# Patient Record
Sex: Male | Born: 1985
Health system: Southern US, Community
[De-identification: ages and names within clinical notes are randomized; demographics above are authoritative.]

## PROBLEM LIST (undated history)

## (undated) DIAGNOSIS — E7800 Pure hypercholesterolemia, unspecified: Secondary | ICD-10-CM

## (undated) HISTORY — PX: FOOT SURGERY: SHX648

## (undated) HISTORY — PX: OTHER SURGICAL HISTORY: SHX169

---

## 2014-09-24 ENCOUNTER — Emergency Department (HOSPITAL_BASED_OUTPATIENT_CLINIC_OR_DEPARTMENT_OTHER)
Admission: EM | Admit: 2014-09-24 | Discharge: 2014-09-25 | Disposition: A | Discharge status: Home / Self Care | Payer: 59 | Attending: Emergency Medicine | Admitting: Emergency Medicine

## 2014-09-24 ENCOUNTER — Encounter (HOSPITAL_BASED_OUTPATIENT_CLINIC_OR_DEPARTMENT_OTHER): Payer: Self-pay | Admitting: *Deleted

## 2014-09-24 ENCOUNTER — Emergency Department (HOSPITAL_BASED_OUTPATIENT_CLINIC_OR_DEPARTMENT_OTHER): Payer: 59

## 2014-09-24 DIAGNOSIS — R1033 Periumbilical pain: Secondary | ICD-10-CM

## 2014-09-24 DIAGNOSIS — R109 Unspecified abdominal pain: Secondary | ICD-10-CM | POA: Diagnosis present

## 2014-09-24 DIAGNOSIS — Z880 Allergy status to penicillin: Secondary | ICD-10-CM | POA: Diagnosis not present

## 2014-09-24 DIAGNOSIS — R1031 Right lower quadrant pain: Secondary | ICD-10-CM | POA: Insufficient documentation

## 2014-09-24 DIAGNOSIS — R1030 Lower abdominal pain, unspecified: Secondary | ICD-10-CM | POA: Insufficient documentation

## 2014-09-24 LAB — URINALYSIS, ROUTINE W REFLEX MICROSCOPIC
BILIRUBIN URINE: NEGATIVE
GLUCOSE, UA: NEGATIVE mg/dL
HGB URINE DIPSTICK: NEGATIVE
Ketones, ur: NEGATIVE mg/dL
LEUKOCYTES UA: NEGATIVE
Nitrite: NEGATIVE
PH: 6.5 (ref 5.0–8.0)
Protein, ur: NEGATIVE mg/dL
SPECIFIC GRAVITY, URINE: 1.025 (ref 1.005–1.030)
Urobilinogen, UA: 1 mg/dL (ref 0.0–1.0)

## 2014-09-24 NOTE — ED Notes (Signed)
Pt reports RLQ pain intermittent x 2-3 days.  LBM today-normal.  Denies fevers, denies N/V/D, denies urinary symptoms.

## 2014-09-24 NOTE — ED Provider Notes (Signed)
CSN: [id number]     Arrival date & time 09/24/14  2141 History  This chart was scribed for Geoffery Lyonsouglas Banyan Goodchild, MD by Chestine SporeSoijett Blue, ED Scribe. The patient was seen in room MH02/MH02 at 11:34 PM.     Chief Complaint  Patient presents with  . Abdominal Pain      Patient is a 29 y.o. male presenting with abdominal pain. The history is provided by the patient. No language interpreter was used.  Abdominal Pain Pain location:  Suprapubic Pain quality: sharp   Pain radiates to:  Does not radiate Pain severity:  Moderate Onset quality:  Sudden Duration:  3 days Timing:  Sporadic Progression:  Unchanged Chronicity:  New Relieved by:  Nothing Worsened by:  Nothing tried Ineffective treatments:  Antacids Associated symptoms: no constipation, no diarrhea, no fever and no hematuria     HPI Comments: Corwin LevinsBrandon Methot is a 29 y.o. male who presents to the Emergency Department complaining of sharp RLQ abdominal pain onset 2-3 days.  Pt left work because of the pain. He states that he has tried [place] with no relief for his symptoms. He denies constipation, diarrhea, fever, blood in stool, and any other symptoms. Pt had his wisdom teeth pulled Friday and was given pain medications.    History reviewed. No pertinent past medical history. History reviewed. No pertinent past surgical history. History reviewed. No pertinent family history. History  Substance Use Topics  . Smoking status: Never Smoker   . Smokeless tobacco: Not on file  . Alcohol Use: No    Review of Systems  Constitutional: Negative for fever.  Gastrointestinal: Positive for abdominal pain. Negative for diarrhea, constipation and blood in stool.  Genitourinary: Negative for urgency, frequency and hematuria.  All other systems reviewed and are negative.     Allergies  Penicillins  Home Medications   Prior to Admission medications   Not on File   BP 133/70 mmHg  Pulse 77  Temp(Src) 98.3 F (36.8 C) (Oral)  Resp 18  Ht 5'  10" (1.778 m)  Wt 215 lb (97.523 kg)  BMI 30.85 kg/m2  SpO2 99% Physical Exam  Constitutional: He is oriented to person, place, and time. He appears well-developed and well-nourished. No distress.  HENT:  Head: Normocephalic and atraumatic.  Eyes: EOM are normal.  Neck: Neck supple. No tracheal deviation present.  Cardiovascular: Normal rate, regular rhythm and normal heart sounds.   No murmur heard. Pulmonary/Chest: Effort normal and breath sounds normal. No respiratory distress.  Abdominal: Soft. Bowel sounds are normal. He exhibits no distension. There is tenderness.  TTP in the periumbilical region.   Musculoskeletal: Normal range of motion.  Neurological: He is alert and oriented to person, place, and time.  Skin: Skin is warm and dry.  Psychiatric: He has a normal mood and affect. His behavior is normal.  Nursing note and vitals reviewed.   ED Course  Procedures (including critical care time) DIAGNOSTIC STUDIES: Oxygen Saturation is 99% on room air, normal by my interpretation.    COORDINATION OF CARE: 11:37 PM-Discussed treatment plan which includes CT renal stone study, UA with pt at bedside and pt agreed to plan.   Labs Review Labs Reviewed  URINALYSIS, ROUTINE W REFLEX MICROSCOPIC    Imaging Review No results found.   EKG Interpretation None      MDM   Final diagnoses:  None    Laboratory studies, urinalysis, and CT of the abdomen and pelvis are negative for acute process. He will be discharged  with pain meds and when necessary return.    Geoffery Lyons, MD 09/25/14 [id number]

## 2014-09-25 LAB — CBC WITH DIFFERENTIAL/PLATELET
BASOS PCT: 0 % (ref 0–1)
Basophils Absolute: 0 10*3/uL (ref 0.0–0.1)
Eosinophils Absolute: 0.2 10*3/uL (ref 0.0–0.7)
Eosinophils Relative: 2 % (ref 0–5)
HCT: 41.2 % (ref 39.0–52.0)
Hemoglobin: 14 g/dL (ref 13.0–17.0)
LYMPHS ABS: 2.5 10*3/uL (ref 0.7–4.0)
LYMPHS PCT: 33 % (ref 12–46)
MCH: 29.1 pg (ref 26.0–34.0)
MCHC: 34 g/dL (ref 30.0–36.0)
MCV: 85.7 fL (ref 78.0–100.0)
Monocytes Absolute: 0.6 10*3/uL (ref 0.1–1.0)
Monocytes Relative: 8 % (ref 3–12)
NEUTROS PCT: 57 % (ref 43–77)
Neutro Abs: 4.4 10*3/uL (ref 1.7–7.7)
Platelets: 231 10*3/uL (ref 150–400)
RBC: 4.81 MIL/uL (ref 4.22–5.81)
RDW: 12.3 % (ref 11.5–15.5)
WBC: 7.7 10*3/uL (ref 4.0–10.5)

## 2014-09-25 LAB — BASIC METABOLIC PANEL
Anion gap: 8 (ref 5–15)
BUN: 17 mg/dL (ref 6–23)
CHLORIDE: 102 mmol/L (ref 96–112)
CO2: 27 mmol/L (ref 19–32)
Calcium: 8.9 mg/dL (ref 8.4–10.5)
Creatinine, Ser: 1.04 mg/dL (ref 0.50–1.35)
GFR calc Af Amer: >90 mL/min (ref >90)
Glucose, Bld: 90 mg/dL (ref 70–99)
Potassium: 3.6 mmol/L (ref 3.5–5.1)
Sodium: 137 mmol/L (ref 135–145)

## 2014-09-25 MED ORDER — TRAMADOL HCL 50 MG PO TABS: 50.0000 mg | ORAL_TABLET | Freq: Four times a day (QID) | ORAL | Status: DC | PRN

## 2014-09-25 NOTE — Discharge Instructions (Signed)
Prilosec 20 mg twice daily for the next 2 weeks.  Tramadol as prescribed as needed for pain.  Return to the emergency department for worsening pain, high fever, bloody stool, or other new and concerning symptoms.   Abdominal Pain Many things can cause abdominal pain. Usually, abdominal pain is not caused by a disease and will improve without treatment. It can often be observed and treated at home. Your health care provider will do a physical exam and possibly order blood tests and X-rays to help determine the seriousness of your pain. However, in many cases, more time must pass before a clear cause of the pain can be found. Before that point, your health care provider may not know if you need more testing or further treatment. HOME CARE INSTRUCTIONS  Monitor your abdominal pain for any changes. The following actions may help to alleviate any discomfort you are experiencing:  Only take over-the-counter or prescription medicines as directed by your health care provider.  Do not take laxatives unless directed to do so by your health care provider.  Try a clear liquid diet (broth, tea, or water) as directed by your health care provider. Slowly move to a bland diet as tolerated. SEEK MEDICAL CARE IF:  You have unexplained abdominal pain.  You have abdominal pain associated with nausea or diarrhea.  You have pain when you urinate or have a bowel movement.  You experience abdominal pain that wakes you in the night.  You have abdominal pain that is worsened or improved by eating food.  You have abdominal pain that is worsened with eating fatty foods.  You have a fever. SEEK IMMEDIATE MEDICAL CARE IF:   Your pain does not go away within 2 hours.  You keep throwing up (vomiting).  Your pain is felt only in portions of the abdomen, such as the right side or the left lower portion of the abdomen.  You pass bloody or black tarry stools. MAKE SURE YOU:  Understand these instructions.    Will watch your condition.   Will get help right away if you are not doing well or get worse.  Document Released: 04/13/2005 Document Revised: 07/09/2013 Document Reviewed: 03/13/2013 [facility] Patient Information 2015 [employer] [place]. This information is not intended to replace advice given to you by your health care provider. Make sure you discuss any questions you have with your health care provider.

## 2015-04-08 ENCOUNTER — Encounter (HOSPITAL_BASED_OUTPATIENT_CLINIC_OR_DEPARTMENT_OTHER): Payer: Self-pay | Admitting: Emergency Medicine

## 2015-04-08 ENCOUNTER — Emergency Department (HOSPITAL_BASED_OUTPATIENT_CLINIC_OR_DEPARTMENT_OTHER)
Admission: EM | Admit: 2015-04-08 | Discharge: 2015-04-08 | Disposition: A | Discharge status: Home / Self Care | Payer: BLUE CROSS/BLUE SHIELD | Attending: Emergency Medicine | Admitting: Emergency Medicine

## 2015-04-08 DIAGNOSIS — Z880 Allergy status to penicillin: Secondary | ICD-10-CM | POA: Insufficient documentation

## 2015-04-08 DIAGNOSIS — J029 Acute pharyngitis, unspecified: Secondary | ICD-10-CM | POA: Diagnosis not present

## 2015-04-08 LAB — RAPID STREP SCREEN (MED CTR MEBANE ONLY): Streptococcus, Group A Screen (Direct): NEGATIVE

## 2015-04-08 MED ORDER — GI COCKTAIL ~~LOC~~
30.0000 mL | Freq: Once | ORAL | Status: AC
  Administered 2015-04-08: 30 mL via ORAL
  Filled 2015-04-08: qty 30

## 2015-04-08 MED ORDER — LORATADINE 10 MG PO TABS
10.0000 mg | ORAL_TABLET | Freq: Once | ORAL | Status: AC
  Administered 2015-04-08: 10 mg via ORAL
  Filled 2015-04-08: qty 1

## 2015-04-08 MED ORDER — IBUPROFEN 800 MG PO TABS
800.0000 mg | ORAL_TABLET | Freq: Once | ORAL | Status: AC
  Administered 2015-04-08: 800 mg via ORAL
  Filled 2015-04-08: qty 1

## 2015-04-08 MED ORDER — CETIRIZINE-PSEUDOEPHEDRINE ER 5-120 MG PO TB12: 1.0000 | ORAL_TABLET | Freq: Two times a day (BID) | ORAL | Status: DC

## 2015-04-08 MED ORDER — IBUPROFEN 800 MG PO TABS: 800.0000 mg | ORAL_TABLET | Freq: Three times a day (TID) | ORAL | Status: DC

## 2015-04-08 NOTE — ED Notes (Signed)
Started having sore throat on Sunday and just getting worse

## 2015-04-08 NOTE — ED Provider Notes (Signed)
CSN: [id number]     Arrival date & time 04/08/15  0023 History   First MD Initiated Contact with Patient 04/08/15 0125     Chief Complaint  Patient presents with  . Sore Throat     (Consider location/radiation/quality/duration/timing/severity/associated sxs/prior Treatment) Patient is a 29 y.o. male presenting with pharyngitis. The history is provided by the patient.  Sore Throat This is a new problem. The current episode started more than 2 days ago. The problem has not changed since onset.Pertinent negatives include no chest pain, no abdominal pain, no headaches and no shortness of breath. Nothing aggravates the symptoms. Nothing relieves the symptoms. Treatments tried: theraflu. The treatment provided no relief.    History reviewed. No pertinent past medical history. History reviewed. No pertinent past surgical history. History reviewed. No pertinent family history. Social History  Substance Use Topics  . Smoking status: Never Smoker   . Smokeless tobacco: None  . Alcohol Use: No    Review of Systems  Constitutional: Negative for fever and fatigue.  HENT: Positive for congestion and sore throat. Negative for drooling, ear pain, facial swelling, trouble swallowing and voice change.   Respiratory: Negative for shortness of breath.   Cardiovascular: Negative for chest pain.  Gastrointestinal: Negative for abdominal pain.  Neurological: Negative for headaches.  All other systems reviewed and are negative.     Allergies  Penicillins  Home Medications   Prior to Admission medications   Medication Sig Start Date End Date Taking? Authorizing Provider  traMADol (ULTRAM) 50 MG tablet Take 1 tablet (50 mg total) by mouth every 6 (six) hours as needed. 09/25/14   Geoffery Lyons, MD   BP 120/78 mmHg  Pulse 79  Temp(Src) 98.3 F (36.8 C) (Oral)  Resp 18  Ht 6' (1.829 m)  Wt 255 lb (115.667 kg)  BMI 34.58 kg/m2  SpO2 97% Physical Exam  Constitutional: He is oriented to  person, place, and time. He appears well-developed and well-nourished. No distress.  HENT:  Head: Normocephalic and atraumatic.  Mouth/Throat: Oropharynx is clear and moist. No oropharyngeal exudate.  Eyes: Conjunctivae and EOM are normal. Pupils are equal, round, and reactive to light.  Neck: Normal range of motion. Neck supple. No tracheal deviation present.  Intact phonation no pain with displacement of the trachea  Cardiovascular: Normal rate, regular rhythm and intact distal pulses.   Pulmonary/Chest: Effort normal and breath sounds normal. No respiratory distress. He has no wheezes. He has no rales.  Abdominal: Soft. Bowel sounds are normal. There is no tenderness. There is no rebound and no guarding.  Musculoskeletal: Normal range of motion.  Lymphadenopathy:    He has no cervical adenopathy.  Neurological: He is alert and oriented to person, place, and time.  Skin: Skin is warm and dry.  Psychiatric: He has a normal mood and affect.    ED Course  Procedures (including critical care time) Labs Review Labs Reviewed  RAPID STREP SCREEN (NOT AT [facility])  CULTURE, GROUP A STREP    Imaging Review No results found. I have personally reviewed and evaluated these images and lab results as part of my medical decision-making.   EKG Interpretation None      MDM   Final diagnoses:  None    Based on Centor criteria there is no indication for further testing or treatment.  Well appearing intact phonation.  Will treat sore throat symptomatically    April Palumbo, MD 04/08/15 0145

## 2015-04-08 NOTE — ED Notes (Signed)
MD at bedside. 

## 2015-04-11 LAB — CULTURE, GROUP A STREP

## 2015-05-24 ENCOUNTER — Emergency Department (HOSPITAL_BASED_OUTPATIENT_CLINIC_OR_DEPARTMENT_OTHER): Payer: BLUE CROSS/BLUE SHIELD

## 2015-05-24 ENCOUNTER — Emergency Department (HOSPITAL_BASED_OUTPATIENT_CLINIC_OR_DEPARTMENT_OTHER)
Admission: EM | Admit: 2015-05-24 | Discharge: 2015-05-24 | Disposition: A | Discharge status: Home / Self Care | Payer: BLUE CROSS/BLUE SHIELD | Attending: Emergency Medicine | Admitting: Emergency Medicine

## 2015-05-24 ENCOUNTER — Encounter (HOSPITAL_BASED_OUTPATIENT_CLINIC_OR_DEPARTMENT_OTHER): Payer: Self-pay | Admitting: Emergency Medicine

## 2015-05-24 DIAGNOSIS — W270XXA Contact with workbench tool, initial encounter: Secondary | ICD-10-CM | POA: Diagnosis not present

## 2015-05-24 DIAGNOSIS — S60011A Contusion of right thumb without damage to nail, initial encounter: Secondary | ICD-10-CM | POA: Insufficient documentation

## 2015-05-24 DIAGNOSIS — Z79899 Other long term (current) drug therapy: Secondary | ICD-10-CM | POA: Insufficient documentation

## 2015-05-24 DIAGNOSIS — Z880 Allergy status to penicillin: Secondary | ICD-10-CM | POA: Insufficient documentation

## 2015-05-24 DIAGNOSIS — Y990 Civilian activity done for income or pay: Secondary | ICD-10-CM | POA: Insufficient documentation

## 2015-05-24 DIAGNOSIS — Z791 Long term (current) use of non-steroidal anti-inflammatories (NSAID): Secondary | ICD-10-CM | POA: Insufficient documentation

## 2015-05-24 DIAGNOSIS — Y9289 Other specified places as the place of occurrence of the external cause: Secondary | ICD-10-CM | POA: Insufficient documentation

## 2015-05-24 DIAGNOSIS — Y9389 Activity, other specified: Secondary | ICD-10-CM | POA: Diagnosis not present

## 2015-05-24 DIAGNOSIS — S6010XA Contusion of unspecified finger with damage to nail, initial encounter: Secondary | ICD-10-CM

## 2015-05-24 DIAGNOSIS — S6991XA Unspecified injury of right wrist, hand and finger(s), initial encounter: Secondary | ICD-10-CM | POA: Diagnosis present
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
## 2015-05-24 NOTE — Discharge Instructions (Signed)
Subungual Hematoma A subungual hematoma is a pocket of blood that collects under the fingernail or toenail. The pressure created by the blood under the nail can cause pain. CAUSES  A subungual hematoma occurs when an injury to the finger or toe causes a blood vessel beneath the nail to break. The injury can occur from a direct blow such as slamming a finger in a door. It can also occur from a repeated injury such as pressure on the foot in a shoe while running. A subungual hematoma is sometimes called runner's toe or tennis toe. SYMPTOMS   Blue or dark blue skin under the nail.  Pain or throbbing in the injured area. DIAGNOSIS  Your caregiver can determine whether you have a subungual hematoma based on your history and a physical exam. If your caregiver thinks you might have a broken (fractured) bone, X-rays may be taken. TREATMENT  Hematomas usually go away on their own over time. Your caregiver may make a hole in the nail to drain the blood. Draining the blood is painless and usually provides significant relief from pain and throbbing. The nail usually grows back normally after this procedure. In some cases, the nail may need to be removed. This is done if there is a cut under the nail that requires stitches (sutures). HOME CARE INSTRUCTIONS   Put ice on the injured area.  Put ice in a plastic bag.  Place a towel between your skin and the bag.  Leave the ice on for 15-20 minutes, 03-04 times a day for the first 1 to 2 days.  Elevate the injured area to help decrease pain and swelling.  If you were given a bandage, wear it for as long as directed by your caregiver.  If part of your nail falls off, trim the remaining nail gently. This prevents the nail from catching on something and causing further injury.  Only take over-the-counter or prescription medicines for pain, discomfort, or fever as directed by your caregiver. SEEK IMMEDIATE MEDICAL CARE IF:   You have redness or swelling  around the nail.  You have yellowish-white fluid (pus) coming from the nail.  Your pain is not controlled with medicine.  You have a fever. MAKE SURE YOU:  Understand these instructions.  Will watch your condition.  Will get help right away if you are not doing well or get worse.   This information is not intended to replace advice given to you by your health care provider. Make sure you discuss any questions you have with your health care provider.   Document Released: 07/01/2000 Document Revised: 09/26/2011 Document Reviewed: 11/19/2014 Elsevier Interactive Patient Education 2016 [employer]  
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
## 2015-05-24 NOTE — ED Notes (Signed)
Pt made aware to return if symptoms worsen or if any life threatening symptoms occur.   

## 2015-06-16 ENCOUNTER — Emergency Department (HOSPITAL_BASED_OUTPATIENT_CLINIC_OR_DEPARTMENT_OTHER)
Admission: EM | Admit: 2015-06-16 | Discharge: 2015-06-16 | Disposition: A | Discharge status: Home / Self Care | Payer: BLUE CROSS/BLUE SHIELD | Attending: Emergency Medicine | Admitting: Emergency Medicine

## 2015-06-16 ENCOUNTER — Encounter (HOSPITAL_BASED_OUTPATIENT_CLINIC_OR_DEPARTMENT_OTHER): Payer: Self-pay | Admitting: Emergency Medicine

## 2015-06-16 ENCOUNTER — Emergency Department (HOSPITAL_BASED_OUTPATIENT_CLINIC_OR_DEPARTMENT_OTHER): Payer: BLUE CROSS/BLUE SHIELD

## 2015-06-16 DIAGNOSIS — Z880 Allergy status to penicillin: Secondary | ICD-10-CM | POA: Insufficient documentation

## 2015-06-16 DIAGNOSIS — L739 Follicular disorder, unspecified: Secondary | ICD-10-CM | POA: Diagnosis not present

## 2015-06-16 DIAGNOSIS — Z79899 Other long term (current) drug therapy: Secondary | ICD-10-CM | POA: Diagnosis not present

## 2015-06-16 DIAGNOSIS — R21 Rash and other nonspecific skin eruption: Secondary | ICD-10-CM | POA: Diagnosis present

## 2015-06-16 DIAGNOSIS — M79662 Pain in left lower leg: Secondary | ICD-10-CM | POA: Insufficient documentation

## 2015-06-16 MED ORDER — ACETAMINOPHEN 500 MG PO TABS
1000.0000 mg | ORAL_TABLET | Freq: Once | ORAL | Status: AC
  Administered 2015-06-16: 1000 mg via ORAL
  Filled 2015-06-16: qty 2

## 2015-06-16 MED ORDER — SULFAMETHOXAZOLE-TRIMETHOPRIM 800-160 MG PO TABS: 1.0000 | ORAL_TABLET | Freq: Two times a day (BID) | ORAL | Status: DC

## 2015-06-16 NOTE — ED Provider Notes (Signed)
CSN: [id number]     Arrival date & time 06/16/15  1714 History   First MD Initiated Contact with Patient 06/16/15 1724     Chief Complaint  Patient presents with  . Rash     (Consider location/radiation/quality/duration/timing/severity/associated sxs/prior Treatment) HPI Comments: Patient is a 29 year old male with no significant past medical history. He presents for evaluation of rash to both legs. He states that he started with small bumps to his legs yesterday morning. He denies any new exposures. He denies any fevers or chills at home. He also reports waking up yesterday with pain in his left calf in the absence of any injury or trauma.  Patient is a 29 y.o. male presenting with rash. The history is provided by the patient.  Rash Location: Legs. Quality: itchiness   Severity:  Moderate Onset quality:  Sudden Duration:  3 days Timing:  Constant Progression:  Worsening Chronicity:  New   History reviewed. No pertinent past medical history. History reviewed. No pertinent past surgical history. History reviewed. No pertinent family history. Social History  Substance Use Topics  . Smoking status: Never Smoker   . Smokeless tobacco: None  . Alcohol Use: No    Review of Systems  Skin: Positive for rash.  All other systems reviewed and are negative.     Allergies  Penicillins  Home Medications   Prior to Admission medications   Medication Sig Start Date End Date Taking? Authorizing Provider  cetirizine-pseudoephedrine (ZYRTEC-D) 5-120 MG per tablet Take 1 tablet by mouth 2 (two) times daily. 04/08/15   April Palumbo, MD  ibuprofen (ADVIL,MOTRIN) 800 MG tablet Take 1 tablet (800 mg total) by mouth 3 (three) times daily. 04/08/15   April Palumbo, MD  traMADol (ULTRAM) 50 MG tablet Take 1 tablet (50 mg total) by mouth every 6 (six) hours as needed. 09/25/14   Geoffery Lyonsouglas Jewel Mcafee, MD   BP 130/81 mmHg  Pulse 88  Temp(Src) 100.1 F (37.8 C) (Oral)  Resp 18  Ht 6' (1.829 m)  Wt  245 lb (111.131 kg)  BMI 33.22 kg/m2  SpO2 99% Physical Exam  Constitutional: He is oriented to person, place, and time. He appears well-developed and well-nourished. No distress.  HENT:  Head: Normocephalic and atraumatic.  Neck: Normal range of motion. Neck supple.  Musculoskeletal: Normal range of motion. He exhibits no edema.  The left calf appears grossly normal. There is no tenderness.  Homans sign is absent.  Neurological: He is alert and oriented to person, place, and time.  Skin: Skin is warm and dry. He is not diaphoretic.  There are multiple, small pustules to the lower extremities.  Nursing note and vitals reviewed.   ED Course  Procedures (including critical care time) Labs Review Labs Reviewed - No data to display  Imaging Review No results found. I have personally reviewed and evaluated these images and lab results as part of my medical decision-making.   EKG Interpretation None      MDM   Final diagnoses:  None    Ultrasound is negative for DVT. I suspect a musculoskeletal etiology to the calf pain. He does appear to have a folliculitis to his legs. This will be treated with Bactrim and when necessary follow-up.    Geoffery Lyonsouglas Shatoya Roets, MD 06/16/15 1900

## 2015-06-16 NOTE — Discharge Instructions (Signed)
Ibuprofen 600 mg every 6 hours as needed for pain.  Bactrim as prescribed.  Return to the emergency department if symptoms significantly worsen or change.   Folliculitis Folliculitis is redness, soreness, and swelling (inflammation) of the hair follicles. This condition can occur anywhere on the body. People with weakened immune systems, diabetes, or obesity have a greater risk of getting folliculitis. CAUSES  Bacterial infection. This is the most common cause.  Fungal infection.  Viral infection.  Contact with certain chemicals, especially oils and tars. Long-term folliculitis can result from bacteria that live in the nostrils. The bacteria may trigger multiple outbreaks of folliculitis over time. SYMPTOMS Folliculitis most commonly occurs on the scalp, thighs, legs, back, buttocks, and areas where hair is shaved frequently. An early sign of folliculitis is a small, white or yellow, pus-filled, itchy lesion (pustule). These lesions appear on a red, inflamed follicle. They are usually less than 0.2 inches (5 mm) wide. When there is an infection of the follicle that goes deeper, it becomes a boil or furuncle. A group of closely packed boils creates a larger lesion (carbuncle). Carbuncles tend to occur in hairy, sweaty areas of the body. DIAGNOSIS  Your caregiver can usually tell what is wrong by doing a physical exam. A sample may be taken from one of the lesions and tested in a lab. This can help determine what is causing your folliculitis. TREATMENT  Treatment may include:  Applying warm compresses to the affected areas.  Taking antibiotic medicines orally or applying them to the skin.  Draining the lesions if they contain a large amount of pus or fluid.  Laser hair removal for cases of long-lasting folliculitis. This helps to prevent regrowth of the hair. HOME CARE INSTRUCTIONS  Apply warm compresses to the affected areas as directed by your caregiver.  If antibiotics are  prescribed, take them as directed. Finish them even if you start to feel better.  You may take over-the-counter medicines to relieve itching.  Do not shave irritated skin.  Follow up with your caregiver as directed. SEEK IMMEDIATE MEDICAL CARE IF:   You have increasing redness, swelling, or pain in the affected area.  You have a fever. MAKE SURE YOU:  Understand these instructions.  Will watch your condition.  Will get help right away if you are not doing well or get worse.   This information is not intended to replace advice given to you by your health care provider. Make sure you discuss any questions you have with your health care provider.   Document Released: 09/12/2001 Document Revised: 07/25/2014 Document Reviewed: 10/04/2011 Elsevier Interactive Patient Education 2016 [employer]  Musculoskeletal Pain Musculoskeletal pain is muscle and boney aches and pains. These pains can occur in any part of the body. Your caregiver may treat you without knowing the cause of the pain. They may treat you if blood or urine tests, X-rays, and other tests were normal.  CAUSES There is often not a definite cause or reason for these pains. These pains may be caused by a type of germ (virus). The discomfort may also come from overuse. Overuse includes working out too hard when your body is not fit. Boney aches also come from weather changes. Bone is sensitive to atmospheric pressure changes. HOME CARE INSTRUCTIONS   Ask when your test results will be ready. Make sure you get your test results.  Only take over-the-counter or prescription medicines for pain, discomfort, or fever as directed by your caregiver. If you were given medications  for your condition, do not drive, operate machinery or power tools, or sign legal documents for 24 hours. Do not drink alcohol. Do not take sleeping pills or other medications that may interfere with treatment.  Continue all activities unless the activities  cause more pain. When the pain lessens, slowly resume normal activities. Gradually increase the intensity and duration of the activities or exercise.  During periods of severe pain, bed rest may be helpful. Lay or sit in any position that is comfortable.  Putting ice on the injured area.  Put ice in a bag.  Place a towel between your skin and the bag.  Leave the ice on for 15 to 20 minutes, 3 to 4 times a day.  Follow up with your caregiver for continued problems and no reason can be found for the pain. If the pain becomes worse or does not go away, it may be necessary to repeat tests or do additional testing. Your caregiver may need to look further for a possible cause. SEEK IMMEDIATE MEDICAL CARE IF:  You have pain that is getting worse and is not relieved by medications.  You develop chest pain that is associated with shortness or breath, sweating, feeling sick to your stomach (nauseous), or throw up (vomit).  Your pain becomes localized to the abdomen.  You develop any new symptoms that seem different or that concern you. MAKE SURE YOU:   Understand these instructions.  Will watch your condition.  Will get help right away if you are not doing well or get worse.   This information is not intended to replace advice given to you by your health care provider. Make sure you discuss any questions you have with your health care provider.   Document Released: 07/04/2005 Document Revised: 09/26/2011 Document Reviewed: 03/08/2013 Elsevier Interactive Patient Education [employer]

## 2015-06-16 NOTE — ED Notes (Signed)
Patient states that he is "breaking out" in bumps all over his legs. He also reports that his left leg is tight.

## 2015-06-20 ENCOUNTER — Emergency Department (HOSPITAL_BASED_OUTPATIENT_CLINIC_OR_DEPARTMENT_OTHER)
Admission: EM | Admit: 2015-06-20 | Discharge: 2015-06-20 | Disposition: A | Discharge status: Home / Self Care | Payer: BLUE CROSS/BLUE SHIELD | Attending: Emergency Medicine | Admitting: Emergency Medicine

## 2015-06-20 ENCOUNTER — Encounter (HOSPITAL_BASED_OUTPATIENT_CLINIC_OR_DEPARTMENT_OTHER): Payer: Self-pay | Admitting: *Deleted

## 2015-06-20 DIAGNOSIS — Z880 Allergy status to penicillin: Secondary | ICD-10-CM | POA: Diagnosis not present

## 2015-06-20 DIAGNOSIS — Z79899 Other long term (current) drug therapy: Secondary | ICD-10-CM | POA: Insufficient documentation

## 2015-06-20 DIAGNOSIS — R21 Rash and other nonspecific skin eruption: Secondary | ICD-10-CM | POA: Diagnosis present

## 2015-06-20 DIAGNOSIS — T7840XA Allergy, unspecified, initial encounter: Secondary | ICD-10-CM

## 2015-06-20 DIAGNOSIS — T370X5A Adverse effect of sulfonamides, initial encounter: Secondary | ICD-10-CM | POA: Insufficient documentation

## 2015-06-20 MED ORDER — PREDNISONE 20 MG PO TABS: ORAL_TABLET | ORAL | Status: DC

## 2015-06-20 MED ORDER — DEXAMETHASONE SODIUM PHOSPHATE 10 MG/ML IJ SOLN
10.0000 mg | Freq: Once | INTRAMUSCULAR | Status: AC
  Administered 2015-06-20: 10 mg via INTRAMUSCULAR
  Filled 2015-06-20: qty 1

## 2015-06-20 NOTE — ED Provider Notes (Signed)
CSN: [id number]     Arrival date & time 06/20/15  [id number] History   First MD Initiated Contact with Patient 06/20/15 0631     Chief Complaint  Patient presents with  . Rash     (Consider location/radiation/quality/duration/timing/severity/associated sxs/prior Treatment) HPI Comments: Presents to the emergency department for evaluation of rash on legs. Patient was seen earlier in the week for rash and diagnosed with folliculitis. He was treated with Bactrim. Since then the rash has changed. He now has itchy raised red lesions on his legs from ankles to thighs. Tongue swelling, throat swelling or difficulty breathing.  Patient is a 29 y.o. male presenting with rash.  Rash   History reviewed. No pertinent past medical history. History reviewed. No pertinent past surgical history. History reviewed. No pertinent family history. Social History  Substance Use Topics  . Smoking status: Never Smoker   . Smokeless tobacco: None  . Alcohol Use: No    Review of Systems  Skin: Positive for rash.  All other systems reviewed and are negative.     Allergies  Penicillins  Home Medications   Prior to Admission medications   Medication Sig Start Date End Date Taking? Authorizing Provider  cetirizine-pseudoephedrine (ZYRTEC-D) 5-120 MG per tablet Take 1 tablet by mouth 2 (two) times daily. 04/08/15   April Palumbo, MD  ibuprofen (ADVIL,MOTRIN) 800 MG tablet Take 1 tablet (800 mg total) by mouth 3 (three) times daily. 04/08/15   April Palumbo, MD  sulfamethoxazole-trimethoprim (BACTRIM DS,SEPTRA DS) 800-160 MG tablet Take 1 tablet by mouth 2 (two) times daily. 06/16/15 06/23/15  Geoffery Lyonsouglas Delo, MD  traMADol (ULTRAM) 50 MG tablet Take 1 tablet (50 mg total) by mouth every 6 (six) hours as needed. 09/25/14   Geoffery Lyonsouglas Delo, MD   BP 134/70 mmHg  Pulse 84  Temp(Src) 98.7 F (37.1 C) (Oral)  Resp 18  Ht 6' (1.829 m)  Wt 245 lb (111.131 kg)  BMI 33.22 kg/m2  SpO2 97% Physical Exam  Constitutional:  He is oriented to person, place, and time. He appears well-developed and well-nourished. No distress.  HENT:  Head: Normocephalic and atraumatic.  Right Ear: Hearing normal.  Left Ear: Hearing normal.  Nose: Nose normal.  Mouth/Throat: Oropharynx is clear and moist and mucous membranes are normal.  Eyes: Conjunctivae and EOM are normal. Pupils are equal, round, and reactive to light.  Neck: Normal range of motion. Neck supple.  Cardiovascular: Regular rhythm, S1 normal and S2 normal.  Exam reveals no gallop and no friction rub.   No murmur heard. Pulmonary/Chest: Effort normal and breath sounds normal. No respiratory distress. He exhibits no tenderness.  Abdominal: Soft. Normal appearance and bowel sounds are normal. There is no hepatosplenomegaly. There is no tenderness. There is no rebound, no guarding, no tenderness at McBurney's point and negative Murphy's sign. No hernia.  Musculoskeletal: Normal range of motion.  Neurological: He is alert and oriented to person, place, and time. He has normal strength. No cranial nerve deficit or sensory deficit. Coordination normal. GCS eye subscore is 4. GCS verbal subscore is 5. GCS motor subscore is 6.  Skin: Skin is warm, dry and intact. No rash (patchy, occasionally confluent, well-circumscribed, raised lesions that are erythematous on both lower extremities) noted. No cyanosis.  Psychiatric: He has a normal mood and affect. His speech is normal and behavior is normal. Thought content normal.  Nursing note and vitals reviewed.   ED Course  Procedures (including critical care time) Labs Review Labs Reviewed - No data  to display  Imaging Review No results found. I have personally reviewed and evaluated these images and lab results as part of my medical decision-making.   EKG Interpretation None      MDM   Final diagnoses:  None   allergic reaction  Patient seen and treated in the ER earlier in the week for possible folliculitis.  Reviewing the records at that presentation does reveal that he did have a fever and skin infection was a reasonable consideration. Pathology of rash today, however, does not look like folliculitis or cellulitis. He has a well-circumscribed rash scattered over his lower extremities, raised, erythematous and occasionally confluent. This was concerning for possible reaction to the Bactrim. Stop Bactrim, initiate corticosteroid therapy. Patient does indicate that he had a previous reaction similar to this when he was given penicillin several years ago.    Gilda Crease, MD 06/20/15 [phone]

## 2015-06-20 NOTE — ED Notes (Signed)
Red rash on bilateral legs.  Denies pain.

## 2015-06-20 NOTE — Discharge Instructions (Signed)
Drug Allergy °Allergic reactions to medicines are common. Some allergic reactions are mild. A delayed type of drug allergy that occurs 1 week or more after exposure to a medicine or vaccine is called serum sickness. A life-threatening, sudden (acute) allergic reaction that involves the whole body is called anaphylaxis. °CAUSES  °"True" drug allergies occur when there is an allergic reaction to a medicine. This is caused by overactivity of the immune system. First, the body becomes sensitized. The immune system is triggered by your first exposure to the medicine. Following this first exposure, future exposure to the same medicine may be life-threatening. °Almost any medicine can cause an allergic reaction. Common ones are: °· Penicillin. °· Sulfonamides (sulfa drugs). °· Local anesthetics. °· X-ray dyes that contain iodine. °SYMPTOMS  °Common symptoms of a minor allergic reaction are: °· Swelling around the mouth. °· An itchy red rash or hives. °· Vomiting or diarrhea. °Anaphylaxis can cause swelling of the mouth and throat. This makes it difficult to breathe and swallow. Severe reactions can be fatal within seconds, even after exposure to only a trace amount of the drug that causes the reaction. °HOME CARE INSTRUCTIONS °· If you are unsure of what caused your reaction, write down: °¨ The names of the medicines you took. °¨ How much medicine you took. °¨ How you took the medicine, such as whether you took a pill, injected the medicine, or applied it to your skin. °¨ All of the things you ate and drank. °¨ The date and time of your reaction. °¨ The symptoms of the reaction. °· You may want to follow up with an allergy specialist after the reaction has cleared in order to be tested to confirm the allergy. It is important to confirm that your reaction is an allergy, not just a side effect to the medicine. If you have a true allergy to a medicine, this may prevent that medicine and related medicines from being given to  you when you are very ill. °· If you have hives or a rash: °¨ Take medicines as directed by your caregiver. °¨ You may use an over-the-counter antihistamine (diphenhydramine) as needed. °¨ Apply cold compresses to the skin or take baths in cool water. Avoid hot baths or showers. °· If you are severely allergic: °¨ Continuous observation after a severe reaction may be needed. Hospitalization is often required. °¨ Wear a medical alert bracelet or necklace stating your allergy. °¨ You and your family must learn how to use an anaphylaxis kit or give an epinephrine injection to temporarily treat an emergency allergic reaction. If you have had a severe reaction, always carry your epinephrine injection or anaphylaxis kit with you. This can be lifesaving if you have a severe reaction. °· Do not drive or perform tasks after treatment until the medicines used to treat your reaction have worn off, or until your caregiver says it is okay. °· If you have a drug allergy that was confirmed by your health care provider: °¨ Carry information about the drug allergy with you at all times. °¨ Always check with a pharmacist before taking any over-the-counter medicine. °SEEK MEDICAL CARE IF:  °· You think you had an allergic reaction. Symptoms usually start within 30 minutes after exposure. °· Symptoms are getting worse rather than better. °· You develop new symptoms. °· The symptoms that brought you to your caregiver return. °SEEK IMMEDIATE MEDICAL CARE IF:  °· You have swelling of the mouth, difficulty breathing, or wheezing. °· You have a tight   feeling in your chest or throat.  You develop hives, swelling, or itching all over your body.  You develop severe vomiting or diarrhea.  You feel faint or pass out. This is an emergency. Use your epinephrine injection or anaphylaxis kit as you have been instructed. Call for emergency medical help. Even if you improve after the injection, you need to be examined at a hospital emergency  department. MAKE SURE YOU:   Understand these instructions.  Will watch your condition.  Will get help right away if you are not doing well or get worse.   This information is not intended to replace advice given to you by your health care provider. Make sure you discuss any questions you have with your health care provider.   Document Released: 07/04/2005 Document Revised: 07/25/2014 Document Reviewed: 02/03/2015 Elsevier Interactive Patient Education [employer].

## 2015-11-06 ENCOUNTER — Emergency Department (HOSPITAL_BASED_OUTPATIENT_CLINIC_OR_DEPARTMENT_OTHER): Payer: BLUE CROSS/BLUE SHIELD

## 2015-11-06 ENCOUNTER — Emergency Department (HOSPITAL_BASED_OUTPATIENT_CLINIC_OR_DEPARTMENT_OTHER)
Admission: EM | Admit: 2015-11-06 | Discharge: 2015-11-06 | Disposition: A | Discharge status: Home / Self Care | Payer: BLUE CROSS/BLUE SHIELD | Attending: Emergency Medicine | Admitting: Emergency Medicine

## 2015-11-06 ENCOUNTER — Encounter (HOSPITAL_BASED_OUTPATIENT_CLINIC_OR_DEPARTMENT_OTHER): Payer: Self-pay | Admitting: Emergency Medicine

## 2015-11-06 DIAGNOSIS — T148XXA Other injury of unspecified body region, initial encounter: Secondary | ICD-10-CM

## 2015-11-06 DIAGNOSIS — Y999 Unspecified external cause status: Secondary | ICD-10-CM | POA: Diagnosis not present

## 2015-11-06 DIAGNOSIS — B353 Tinea pedis: Secondary | ICD-10-CM

## 2015-11-06 DIAGNOSIS — S61309A Unspecified open wound of unspecified finger with damage to nail, initial encounter: Secondary | ICD-10-CM | POA: Insufficient documentation

## 2015-11-06 DIAGNOSIS — Y929 Unspecified place or not applicable: Secondary | ICD-10-CM | POA: Insufficient documentation

## 2015-11-06 DIAGNOSIS — M79672 Pain in left foot: Secondary | ICD-10-CM | POA: Diagnosis present

## 2015-11-06 DIAGNOSIS — Y939 Activity, unspecified: Secondary | ICD-10-CM | POA: Insufficient documentation

## 2015-11-06 DIAGNOSIS — Z23 Encounter for immunization: Secondary | ICD-10-CM | POA: Insufficient documentation

## 2015-11-06 DIAGNOSIS — W228XXA Striking against or struck by other objects, initial encounter: Secondary | ICD-10-CM | POA: Diagnosis not present

## 2015-11-06 MED ORDER — MUPIROCIN CALCIUM 2 % EX CREA: 1.0000 "application " | TOPICAL_CREAM | Freq: Two times a day (BID) | CUTANEOUS | Status: DC

## 2015-11-06 MED ORDER — NAPROXEN 250 MG PO TABS
500.0000 mg | ORAL_TABLET | Freq: Once | ORAL | Status: AC
  Administered 2015-11-06: 500 mg via ORAL
  Filled 2015-11-06: qty 2

## 2015-11-06 MED ORDER — TETANUS-DIPHTH-ACELL PERTUSSIS 5-2.5-18.5 LF-MCG/0.5 IM SUSP
0.5000 mL | Freq: Once | INTRAMUSCULAR | Status: AC
  Administered 2015-11-06: 0.5 mL via INTRAMUSCULAR
  Filled 2015-11-06: qty 0.5

## 2015-11-06 NOTE — ED Provider Notes (Signed)
CSN: [id number]     Arrival date & time 11/06/15  0138 History   First MD Initiated Contact with Patient 11/06/15 0153     Chief Complaint  Patient presents with  . Foot Pain     (Consider location/radiation/quality/duration/timing/severity/associated sxs/prior Treatment) Patient is a 30 y.o. male presenting with lower extremity pain. The history is provided by the patient.  Foot Pain This is a new problem. The current episode started more than 2 days ago. The problem occurs constantly. The problem has not changed since onset.Pertinent negatives include no chest pain, no abdominal pain, no headaches and no shortness of breath. Nothing aggravates the symptoms. Nothing relieves the symptoms. Treatments tried: ibuprofen. The treatment provided no relief.  Pulled a piece of skin on the sole of the foot off with his sock on Tuesday has had pain since.  History reviewed. No pertinent past medical history. History reviewed. No pertinent past surgical history. History reviewed. No pertinent family history. Social History  Substance Use Topics  . Smoking status: Never Smoker   . Smokeless tobacco: None  . Alcohol Use: No    Review of Systems  Respiratory: Negative for shortness of breath.   Cardiovascular: Negative for chest pain.  Gastrointestinal: Negative for abdominal pain.  Neurological: Negative for headaches.  All other systems reviewed and are negative.     Allergies  Penicillins and Bactrim  Home Medications   Prior to Admission medications   Medication Sig Start Date End Date Taking? Authorizing Provider  cetirizine-pseudoephedrine (ZYRTEC-D) 5-120 MG per tablet Take 1 tablet by mouth 2 (two) times daily. 04/08/15   Rana Adorno, MD  ibuprofen (ADVIL,MOTRIN) 800 MG tablet Take 1 tablet (800 mg total) by mouth 3 (three) times daily. 04/08/15   Iva Posten, MD  predniSONE (DELTASONE) 20 MG tablet 3 tabs po daily x 3 days, then 2 tabs x 3 days, then 1.5 tabs x 3 days, then  1 tab x 3 days, then 0.5 tabs x 3 days 06/20/15   Gilda Crease, MD  traMADol (ULTRAM) 50 MG tablet Take 1 tablet (50 mg total) by mouth every 6 (six) hours as needed. 09/25/14   Geoffery Lyons, MD   BP 134/87 mmHg  Pulse 67  Temp(Src) 98.7 F (37.1 C) (Oral)  Resp 18  Ht 6' (1.829 m)  Wt 250 lb (113.399 kg)  BMI 33.90 kg/m2  SpO2 100% Physical Exam  Constitutional: He is oriented to person, place, and time. He appears well-developed and well-nourished. No distress.  HENT:  Head: Normocephalic and atraumatic.  Mouth/Throat: Oropharynx is clear and moist.  Eyes: Conjunctivae are normal. Pupils are equal, round, and reactive to light.  Neck: Normal range of motion. Neck supple.  Cardiovascular: Normal rate, regular rhythm and intact distal pulses.   Pulmonary/Chest: Effort normal and breath sounds normal. He has no wheezes. He has no rales.  Abdominal: Soft. Bowel sounds are normal. There is no tenderness.  Musculoskeletal: Normal range of motion.       Left foot: There is normal range of motion, no bony tenderness, no swelling, normal capillary refill, no crepitus, no deformity and no laceration.       Feet:  Neurological: He is alert and oriented to person, place, and time. He has normal reflexes.  Skin: Skin is warm and dry.  Psychiatric: He has a normal mood and affect.    ED Course  Procedures (including critical care time) Labs Review Labs Reviewed - No data to display  Imaging Review No  results found. I have personally reviewed and evaluated these images and lab results as part of my medical decision-making.   EKG Interpretation None      MDM   Final diagnoses:  None   Results for orders placed or performed during the hospital encounter of 04/08/15  Rapid strep screen  Result Value Ref Range   Streptococcus, Group A Screen (Direct) NEGATIVE NEGATIVE  Culture, Group A Strep  Result Value Ref Range   Strep A Culture Comment (A)    Dg Foot Complete  Left  11/06/2015  CLINICAL DATA:  Sock stuck to left foot and would not come off. Patient pulled sock and removed skin from plantar surface of foot. No old or recent injury known. Pain. EXAM: LEFT FOOT - COMPLETE 3+ VIEW COMPARISON:  None. FINDINGS: There is no evidence of fracture or dislocation. Mild subchondral cystic change at the great toe interphalangeal joint. Scattered proliferative change at the talonavicular joint. Soft tissues are unremarkable. No soft tissue air or radiopaque foreign body. IMPRESSION: No acute bony abnormality.  Minimal degenerative change. Electronically Signed   By: Rubye OaksMelanie  Ehinger M.D.   On: 11/06/2015 02:44     Filed Vitals:   11/06/15 0145  BP: 134/87  Pulse: 67  Temp: 98.7 F (37.1 C)  Resp: 18    Medications  Tdap (BOOSTRIX) injection 0.5 mL (0.5 mLs Intramuscular Given 11/06/15 0211)  naproxen (NAPROSYN) tablet 500 mg (500 mg Oral Given 11/06/15 0211)  Wound care provided in the ED  No signs of infection at this time.  Soak BID with warm soapy water.  Use tinactin spray for athletes foot and antibiotic ointment, clean dry dressing.  Strict return precautions given    Trampas Stettner, MD 11/06/15 [id number]

## 2015-11-06 NOTE — ED Notes (Signed)
Patient states that he took off his sock and it was stuck to his foot. Reports that it took off some of the skin

## 2015-11-06 NOTE — Discharge Instructions (Signed)

## 2016-05-19 ENCOUNTER — Emergency Department (HOSPITAL_BASED_OUTPATIENT_CLINIC_OR_DEPARTMENT_OTHER): Payer: BLUE CROSS/BLUE SHIELD

## 2016-05-19 ENCOUNTER — Encounter (HOSPITAL_BASED_OUTPATIENT_CLINIC_OR_DEPARTMENT_OTHER): Payer: Self-pay | Admitting: *Deleted

## 2016-05-19 ENCOUNTER — Emergency Department (HOSPITAL_BASED_OUTPATIENT_CLINIC_OR_DEPARTMENT_OTHER)
Admission: EM | Admit: 2016-05-19 | Discharge: 2016-05-19 | Disposition: A | Discharge status: Home / Self Care | Payer: BLUE CROSS/BLUE SHIELD | Attending: Emergency Medicine | Admitting: Emergency Medicine

## 2016-05-19 DIAGNOSIS — B9789 Other viral agents as the cause of diseases classified elsewhere: Secondary | ICD-10-CM

## 2016-05-19 DIAGNOSIS — J069 Acute upper respiratory infection, unspecified: Secondary | ICD-10-CM | POA: Diagnosis not present

## 2016-05-19 DIAGNOSIS — R0981 Nasal congestion: Secondary | ICD-10-CM | POA: Diagnosis present

## 2016-05-19 LAB — RAPID STREP SCREEN (MED CTR MEBANE ONLY): Streptococcus, Group A Screen (Direct): NEGATIVE

## 2016-05-19 MED ORDER — IBUPROFEN 800 MG PO TABS: 800.0000 mg | ORAL_TABLET | Freq: Three times a day (TID) | ORAL | 0 refills | Status: DC | PRN

## 2016-05-19 MED ORDER — HYDROCODONE-HOMATROPINE 5-1.5 MG/5ML PO SYRP: 5.0000 mL | ORAL_SOLUTION | Freq: Four times a day (QID) | ORAL | 0 refills | Status: DC | PRN

## 2016-05-19 MED ORDER — ALBUTEROL SULFATE HFA 108 (90 BASE) MCG/ACT IN AERS
2.0000 | INHALATION_SPRAY | Freq: Once | RESPIRATORY_TRACT | Status: AC
  Administered 2016-05-19: 2 via RESPIRATORY_TRACT
  Filled 2016-05-19: qty 6.7

## 2016-05-19 MED ORDER — GUAIFENESIN-DM 100-10 MG/5ML PO SYRP: 5.0000 mL | ORAL_SOLUTION | ORAL | 0 refills | Status: DC | PRN

## 2016-05-19 MED FILL — IBUPROFEN 800 MG TABLET: 800 | 7 days supply | Qty: 21 | Fill #0

## 2016-05-19 MED FILL — ROBAFEN-DM SYRUP: 100-10 | 6 days supply | Qty: 118 | Fill #0

## 2016-05-19 MED FILL — HYDROMET SYRUP: 5-1.5 | 5 days supply | Qty: 100 | Fill #0

## 2016-05-19 NOTE — Discharge Instructions (Signed)
Read the information below.  Use the prescribed medication as directed.  Please discuss all new medications with your pharmacist.  You may return to the Emergency Department at any time for worsening condition or any new symptoms that concern you.  If you develop high fevers that do not resolve with tylenol or ibuprofen, you have difficulty swallowing or breathing, or you are unable to tolerate fluids by mouth, return to the ER for a recheck.    °

## 2016-05-19 NOTE — ED Provider Notes (Signed)
[facility] Provider Note   CSN: [id number] Arrival date & time: 05/19/16  1128     History   Chief Complaint Chief Complaint  Patient presents with  . Cough  . Sore Throat    HPI Richard LevinsBrandon Orr is a 30 y.o. male.  HPI   Patient presents with 4 days of cough, sore throat, nasal congestion.  Feels thick mucus that he has trouble getting up, mild SOB, pain and tightness in his chest.  Taking nyquil with little relief.  Denies fevers, ear pain.   Denies medical problems.  Denies smoking.    History reviewed. No pertinent past medical history.  There are no active problems to display for this patient.   History reviewed. No pertinent surgical history.     Home Medications    Prior to Admission medications   Medication Sig Start Date End Date Taking? Authorizing Provider  cetirizine-pseudoephedrine (ZYRTEC-D) 5-120 MG per tablet Take 1 tablet by mouth 2 (two) times daily. 04/08/15   April Palumbo, MD  guaiFENesin-dextromethorphan (ROBITUSSIN DM) 100-10 MG/5ML syrup Take 5 mLs by mouth every 4 (four) hours as needed for cough. 05/19/16   Trixie DredgeEmily Arkeem Harts, PA-C  HYDROcodone-homatropine [facility](HYCODAN) 5-1.5 MG/5ML syrup Take 5 mLs by mouth every 6 (six) hours as needed for cough (and pain). 05/19/16   Trixie DredgeEmily [place], PA-C  ibuprofen (ADVIL,MOTRIN) 800 MG tablet Take 1 tablet (800 mg total) by mouth every 8 (eight) hours as needed. 05/19/16   Trixie DredgeEmily Jashay Roddy, PA-C  mupirocin cream (BACTROBAN) 2 % Apply 1 application topically 2 (two) times daily. 11/06/15   April Palumbo, MD  predniSONE (DELTASONE) 20 MG tablet 3 tabs po daily x 3 days, then 2 tabs x 3 days, then 1.5 tabs x 3 days, then 1 tab x 3 days, then 0.5 tabs x 3 days 06/20/15   Gilda Creasehristopher J Pollina, MD  traMADol (ULTRAM) 50 MG tablet Take 1 tablet (50 mg total) by mouth every 6 (six) hours as needed. 09/25/14   Geoffery Lyonsouglas Delo, MD    Family History No family history on file.  Social History Social History  Substance Use Topics  .  Smoking status: Never Smoker  . Smokeless tobacco: Never Used  . Alcohol use No     Allergies   Penicillins and Bactrim [sulfamethoxazole-trimethoprim]   Review of Systems Review of Systems  All other systems reviewed and are negative.    Physical Exam Updated Vital Signs BP 120/75   Pulse 80   Temp 98.5 F (36.9 C) (Oral)   Resp 18   Ht 6' (1.829 m)   Wt 117 kg   SpO2 98%   BMI 34.99 kg/m   Physical Exam  Constitutional: He appears well-developed and well-nourished. No distress.  HENT:  Head: Normocephalic and atraumatic.  Mouth/Throat: Posterior oropharyngeal erythema present. No oropharyngeal exudate, posterior oropharyngeal edema or tonsillar abscesses.  Eyes: Conjunctivae and EOM are normal. Right eye exhibits no discharge. Left eye exhibits no discharge.  Neck: Normal range of motion. Neck supple.  Cardiovascular: Normal rate and regular rhythm.   Pulmonary/Chest: Effort normal and breath sounds normal. No stridor. No respiratory distress. He has no wheezes. He has no rales.  Lymphadenopathy:    He has no cervical adenopathy.  Neurological: He is alert.  Skin: He is not diaphoretic.  Nursing note and vitals reviewed.    ED Treatments / Results  Labs (all labs ordered are listed, but only abnormal results are displayed) Labs Reviewed  RAPID STREP SCREEN (NOT [facility])  CULTURE, GROUP A STREP [facility])    EKG  EKG Interpretation None       Radiology Dg Chest 2 View  Result Date: 05/19/2016 CLINICAL DATA:  Cough and congestion for 4 days EXAM: CHEST  2 VIEW COMPARISON:  06/18/2014 FINDINGS: Cardiac shadow is within normal limits and stable. The lungs are well aerated bilaterally. No focal infiltrate or sizable effusion is noted. Mild interstitial changes are again seen and stable. No bony abnormality is noted. IMPRESSION: Chronic interstitial changes without acute abnormality. Electronically Signed   By: Alcide CleverMark  Lukens M.D.   On: 05/19/2016 12:05     Procedures Procedures (including critical care time)  Medications Ordered in ED Medications  albuterol (PROVENTIL HFA;VENTOLIN HFA) 108 (90 Base) MCG/ACT inhaler 2 puff (2 puffs Inhalation Given 05/19/16 1202)     Initial Impression / Assessment and Plan / ED Course  I have reviewed the triage vital signs and the nursing notes.  Pertinent labs & imaging results that were available during my care of the patient were reviewed by me and considered in my medical decision making (see chart for details).  Clinical Course   Afebrile, nontoxic patient with constellation of symptoms suggestive of viral syndrome.  No concerning findings on exam.  CXR negative, strep screen negative. Discharged home with supportive care, PCP follow up.  Discussed result, findings, treatment, and follow up  with patient.  Pt given return precautions.  Pt verbalizes understanding and agrees with plan.      Final Clinical Impressions(s) / ED Diagnoses   Final diagnoses:  Viral URI with cough    New Prescriptions New Prescriptions   GUAIFENESIN-DEXTROMETHORPHAN (ROBITUSSIN DM) 100-10 MG/5ML SYRUP    Take 5 mLs by mouth every 4 (four) hours as needed for cough.   HYDROCODONE-HOMATROPINE (HYCODAN) 5-1.5 MG/5ML SYRUP    Take 5 mLs by mouth every 6 (six) hours as needed for cough (and pain).   IBUPROFEN (ADVIL,MOTRIN) 800 MG TABLET    Take 1 tablet (800 mg total) by mouth every 8 (eight) hours as needed.     Trixie Dredgemily Preet Perrier, PA-C 05/19/16 1213    Melene Planan Floyd, DO 05/19/16 1302

## 2016-05-19 NOTE — ED Triage Notes (Signed)
Cough, runny nose, chest congestion and sore throat x 4 days.

## 2016-05-20 LAB — CULTURE, GROUP A STREP (THRC)

## 2016-10-19 IMAGING — US US EXTREM LOW VENOUS*L*
1 series · 13 of 24 positions shown · non-contrast
Comparison: None.

CLINICAL DATA: 29-year-old male with left calf pain x2 days.



[Series 1: us extrem low venous*left* · 0.09mm/px · 31 acquisitions, 13 frames shown]
[im 1/31]
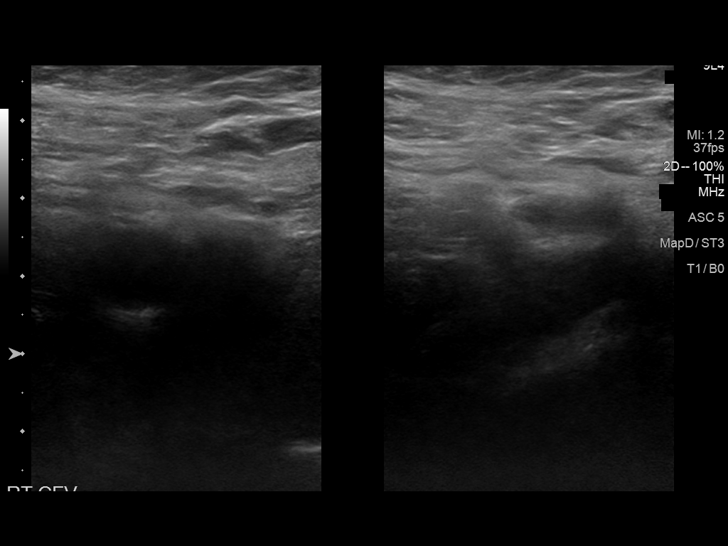
[im 3/31]
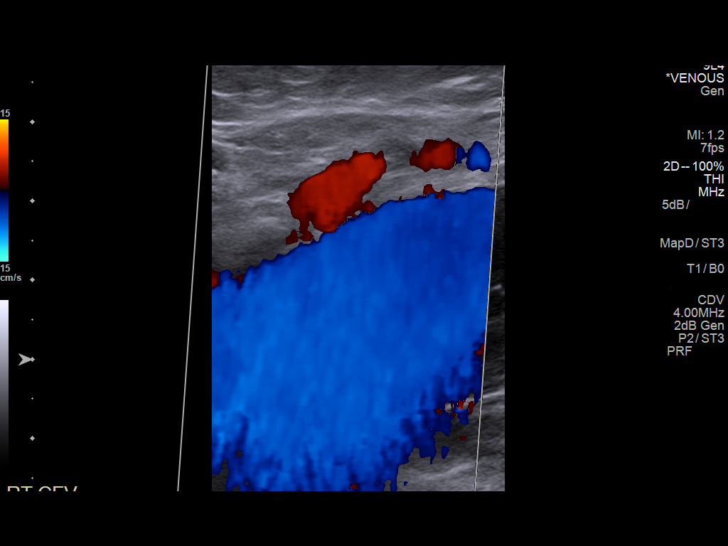
[im 6/31]
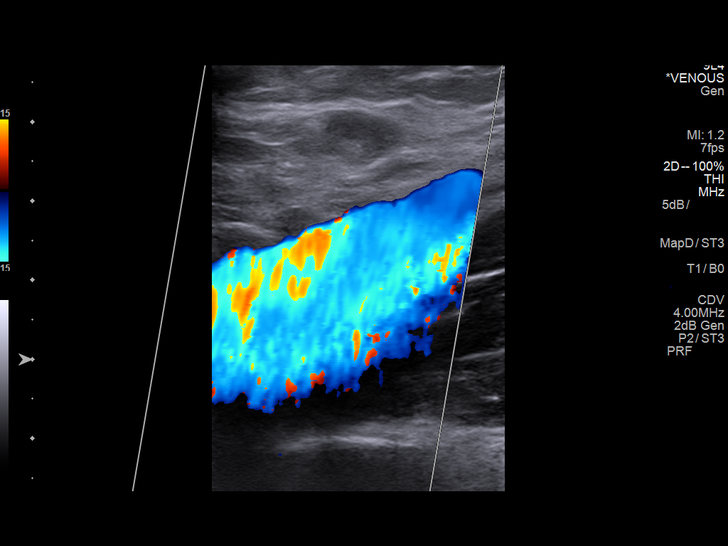
[im 10/31]
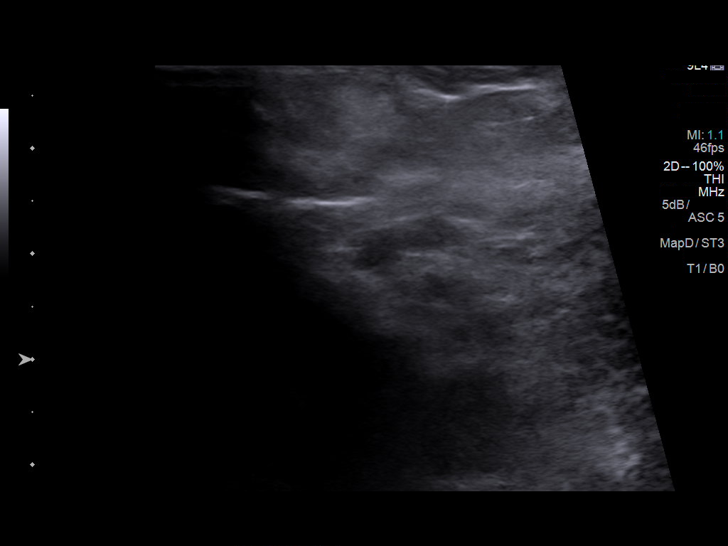
[im 11/31]
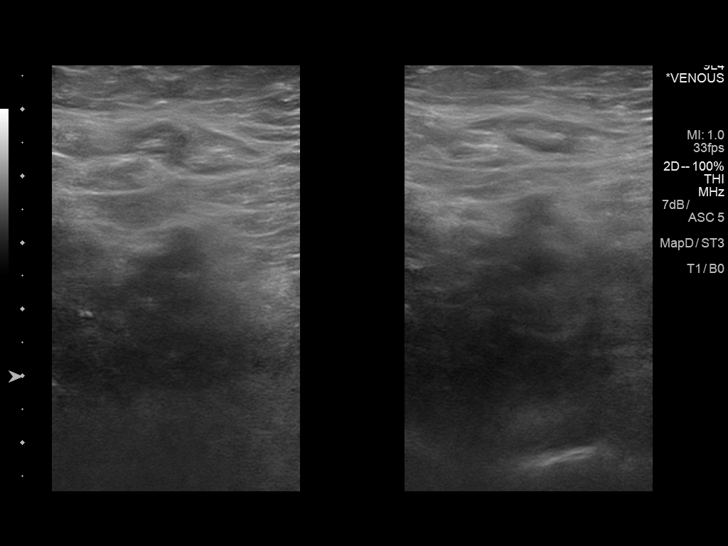
[im 14/31]
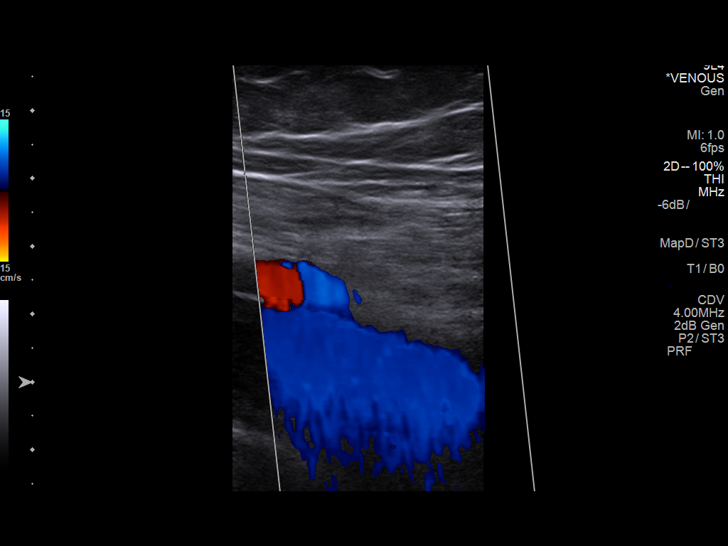
[im 16/31]
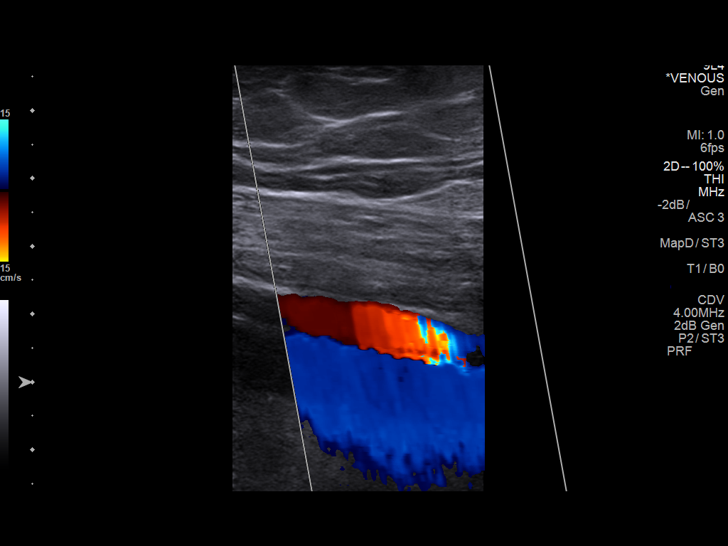
[im 17/31]
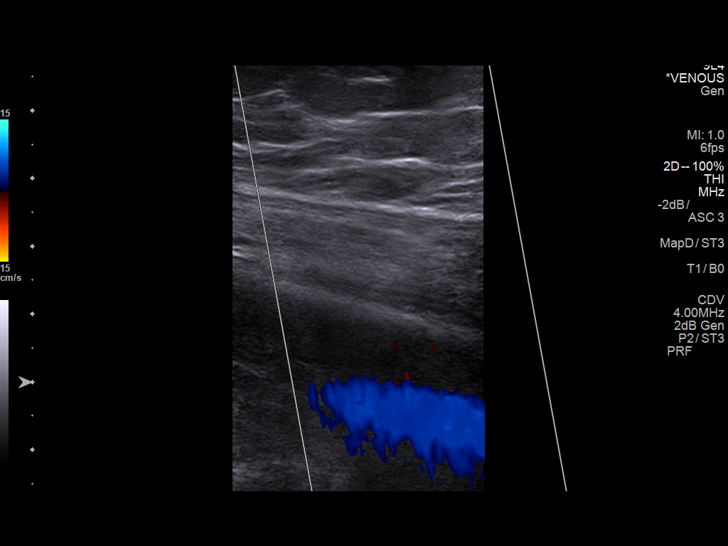
[im 20/31]
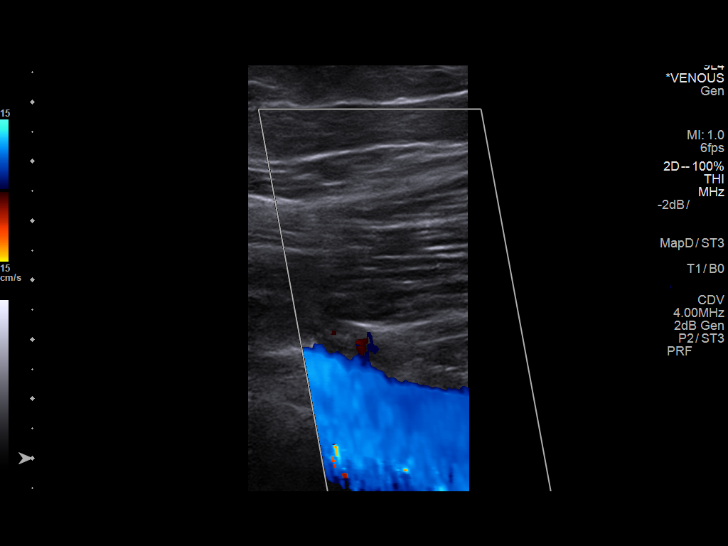
[im 23/31]
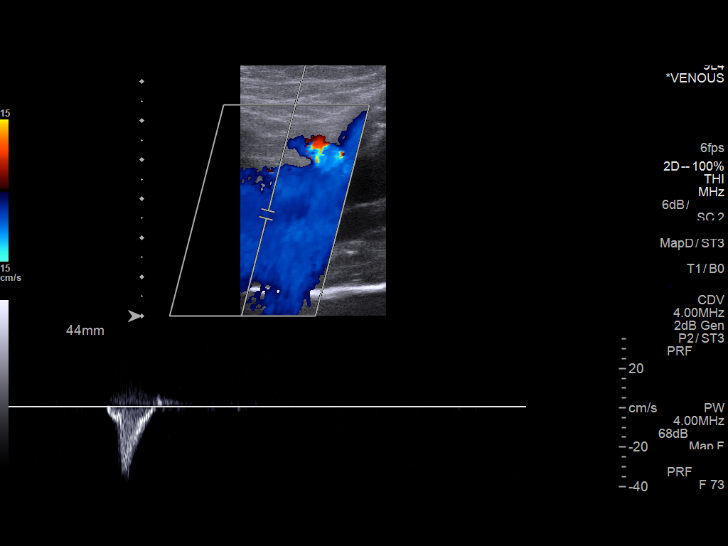
[im 27/31]
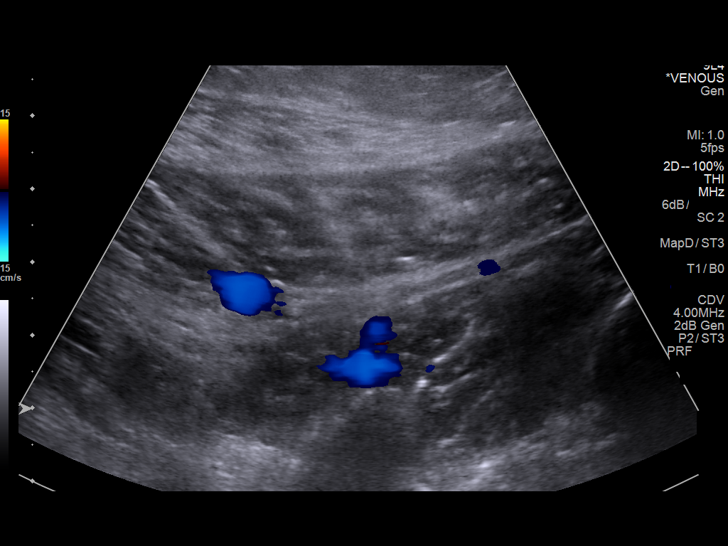
[im 29/31]
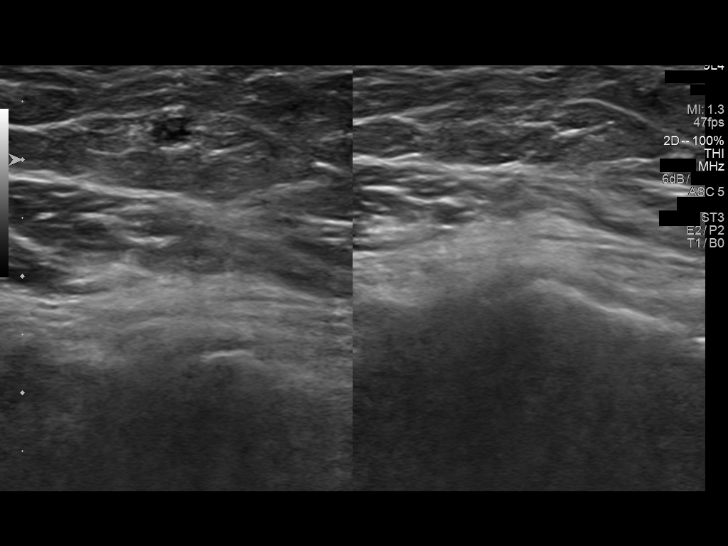
[im 31/31]
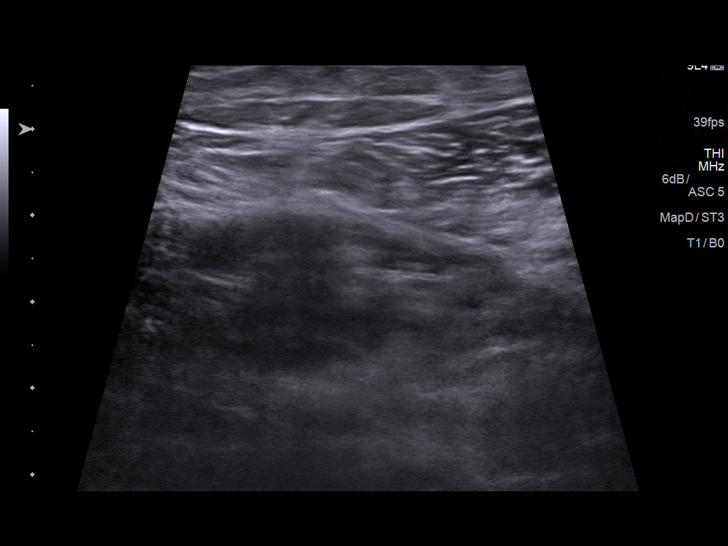

[13 of 24 positions shown; findings below may reference images not displayed]

FINDINGS: Contralateral Common Femoral Vein: Respiratory phasicity is normal
and symmetric with the symptomatic side. No evidence of thrombus.
Normal compressibility.

Common Femoral Vein: No evidence of thrombus. Normal
compressibility, respiratory phasicity and response to augmentation.

Saphenofemoral Junction: No evidence of thrombus. Normal
compressibility and flow on color Doppler imaging.

Profunda Femoral Vein: No evidence of thrombus. Normal
compressibility and flow on color Doppler imaging.

Femoral Vein: No evidence of thrombus. Normal compressibility,
respiratory phasicity and response to augmentation.

Popliteal Vein: No evidence of thrombus. Normal compressibility,
respiratory phasicity and response to augmentation.

Calf Veins: No evidence of thrombus. Normal compressibility and flow
on color Doppler imaging.

Superficial Great Saphenous Vein: No evidence of thrombus. Normal
compressibility and flow on color Doppler imaging.
IMPRESSION: No evidence of deep venous thrombosis.

## 2016-11-30 ENCOUNTER — Emergency Department (HOSPITAL_BASED_OUTPATIENT_CLINIC_OR_DEPARTMENT_OTHER)
Admission: EM | Admit: 2016-11-30 | Discharge: 2016-11-30 | Disposition: A | Discharge status: Home / Self Care | Payer: Worker's Compensation | Attending: Emergency Medicine | Admitting: Emergency Medicine

## 2016-11-30 ENCOUNTER — Emergency Department (HOSPITAL_BASED_OUTPATIENT_CLINIC_OR_DEPARTMENT_OTHER): Payer: Worker's Compensation

## 2016-11-30 ENCOUNTER — Encounter (HOSPITAL_BASED_OUTPATIENT_CLINIC_OR_DEPARTMENT_OTHER): Payer: Self-pay

## 2016-11-30 DIAGNOSIS — Y929 Unspecified place or not applicable: Secondary | ICD-10-CM | POA: Insufficient documentation

## 2016-11-30 DIAGNOSIS — Y939 Activity, unspecified: Secondary | ICD-10-CM | POA: Insufficient documentation

## 2016-11-30 DIAGNOSIS — Y990 Civilian activity done for income or pay: Secondary | ICD-10-CM | POA: Diagnosis not present

## 2016-11-30 DIAGNOSIS — S8991XA Unspecified injury of right lower leg, initial encounter: Secondary | ICD-10-CM | POA: Diagnosis not present

## 2016-11-30 DIAGNOSIS — W3189XA Contact with other specified machinery, initial encounter: Secondary | ICD-10-CM | POA: Insufficient documentation

## 2016-11-30 MED ORDER — NAPROXEN 500 MG PO TABS: 500.0000 mg | ORAL_TABLET | Freq: Two times a day (BID) | ORAL | 0 refills | Status: AC | PRN

## 2016-11-30 MED ORDER — NAPROXEN 250 MG PO TABS
500.0000 mg | ORAL_TABLET | Freq: Once | ORAL | Status: AC
  Administered 2016-11-30: 500 mg via ORAL
  Filled 2016-11-30: qty 2

## 2016-11-30 NOTE — ED Notes (Signed)
Pt verbalizes understanding of d/c instructions and denies any further needs at this time. 

## 2016-11-30 NOTE — ED Notes (Signed)
Patient transported to X-ray 

## 2016-11-30 NOTE — ED Provider Notes (Signed)
[facility] Provider Note: Lowella Dell, MD, FACEP  CSN: [id number] MRN: [id number] ARRIVAL: 11/30/16 at 0034 ROOM: [facility]12/[facility]12   CHIEF COMPLAINT  Knee Injury   HISTORY OF PRESENT ILLNESS  Richard Orr is a 31 y.o. 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 He then subsequently twisted the knee. He is having pain in his anterior right knee which he describes as sharp. It is worse with bending of the right knee and less so with weightbearing. He has tried a knee sleeve, rest and Tylenol with partial relief. He rates his pain as a 7 out of 10 at its worst. There is no associated deformity but there is some mild swelling.   History reviewed. No pertinent past medical history.  History reviewed. No pertinent surgical history.  No family history on file.  Social History  Substance Use Topics  . Smoking status: Never Smoker  . Smokeless tobacco: Never Used  . Alcohol use No    Prior to Admission medications   Medication Sig Start Date End Date Taking? Authorizing Provider  naproxen (NAPROSYN) 500 MG tablet Take 1 tablet (500 mg total) by mouth 2 (two) times daily as needed (for knee pain). 11/30/16   Anntoinette Haefele, MD    Allergies Penicillins and Bactrim [sulfamethoxazole-trimethoprim]   REVIEW OF SYSTEMS  Negative except as noted here or in the History of Present Illness.   PHYSICAL EXAMINATION  Initial Vital Signs Blood pressure (!) 157/80, pulse 67, temperature 98.2 F (36.8 C), temperature source Oral, resp. rate 18, height 6' (1.829 m), weight 247 lb (112 kg), SpO2 98 %.  Examination General: Well-developed, well-nourished male in no acute distress; appearance consistent with age of record HENT: normocephalic; atraumatic Eyes: Normal appearance Neck: supple Heart: regular rate and rhythm Lungs: clear to auscultation bilaterally Abdomen: soft; nondistended; nontender; bowel sounds present Extremities: No deformity;  full range of motion except right knee due to pain; pulses normal; tenderness anterior right knee and pain on passive range of motion of right knee with mild anterior swelling but no appreciable effusion Neurologic: Awake, alert and oriented; motor function intact in all extremities and symmetric; no facial droop Skin: Warm and dry Psychiatric: Normal mood and affect   RESULTS  Summary of this visit's results, reviewed by myself:   EKG Interpretation  Date/Time:    Ventricular Rate:    PR Interval:    QRS Duration:   QT Interval:    QTC Calculation:   R Axis:     Text Interpretation:        Laboratory Studies: No results found for this or any previous visit (from the past 24 hour(s)). Imaging Studies: Dg Knee Complete 4 Views Right  Result Date: 11/30/2016 CLINICAL DATA:  Right knee pain after twisting injury 2 days ago. EXAM: RIGHT KNEE - COMPLETE 4+ VIEW COMPARISON:  None. FINDINGS: No evidence of fracture, dislocation, or joint effusion. The alignment and joint spaces are maintained. No evidence of arthropathy. Rounded 2.1 cm cortically based sclerotic lesion in the distal medial femoral metaphysis. No periosteal reaction cortical irregularity, no suspicious characteristics. Soft tissues are unremarkable. IMPRESSION: 1. No fracture or subluxation of the right knee.  No joint effusion. 2. 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 There are no suspicious characteristics. In the absence of pain prior to injury, no further follow-up is needed. If there were any symptoms prior to acute injury, further characterization  recommended with MRI. Electronically Signed   By: Rubye OaksMelanie  Ehinger M.D.   On: 11/30/2016 01:25    ED COURSE  Nursing notes and initial vitals signs, including pulse oximetry, reviewed.  Vitals:   11/30/16 0038  BP: (!) 157/80  Pulse: 67  Resp: 18  Temp: 98.2 F (36.8 C)  TempSrc: Oral  SpO2: 98%  Weight:  247 lb (112 kg)  Height: 6' (1.829 m)    PROCEDURES    ED DIAGNOSES     ICD-9-CM ICD-10-CM   1. Right knee injury, initial encounter 959.7 S89.91XA        Znya Albino, Jonny RuizJohn, MD 11/30/16 [phone]

## 2016-11-30 NOTE — ED Triage Notes (Signed)
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  He has increased pain today from ambulation and bearing weight on the leg.

## 2017-08-30 ENCOUNTER — Emergency Department (HOSPITAL_BASED_OUTPATIENT_CLINIC_OR_DEPARTMENT_OTHER)
Admission: EM | Admit: 2017-08-30 | Discharge: 2017-08-30 | Disposition: A | Discharge status: Home / Self Care | Payer: BLUE CROSS/BLUE SHIELD | Attending: Emergency Medicine | Admitting: Emergency Medicine

## 2017-08-30 ENCOUNTER — Other Ambulatory Visit: Payer: Self-pay

## 2017-08-30 ENCOUNTER — Encounter (HOSPITAL_BASED_OUTPATIENT_CLINIC_OR_DEPARTMENT_OTHER): Payer: Self-pay | Admitting: *Deleted

## 2017-08-30 ENCOUNTER — Emergency Department (HOSPITAL_BASED_OUTPATIENT_CLINIC_OR_DEPARTMENT_OTHER): Payer: BLUE CROSS/BLUE SHIELD

## 2017-08-30 DIAGNOSIS — R0789 Other chest pain: Secondary | ICD-10-CM | POA: Insufficient documentation

## 2017-08-30 MED ORDER — TRAMADOL HCL 50 MG PO TABS: 50.0000 mg | ORAL_TABLET | Freq: Four times a day (QID) | ORAL | 0 refills | Status: AC | PRN

## 2017-08-30 MED ORDER — CYCLOBENZAPRINE HCL 5 MG PO TABS: 5.0000 mg | ORAL_TABLET | Freq: Three times a day (TID) | ORAL | 0 refills | Status: DC | PRN

## 2017-08-30 MED ORDER — CYCLOBENZAPRINE HCL 5 MG PO TABS
5.0000 mg | ORAL_TABLET | Freq: Once | ORAL | Status: AC
  Administered 2017-08-30: 5 mg via ORAL
  Filled 2017-08-30: qty 1

## 2017-08-30 MED ORDER — IBUPROFEN 800 MG PO TABS
800.0000 mg | ORAL_TABLET | Freq: Once | ORAL | Status: AC
  Administered 2017-08-30: 800 mg via ORAL
  Filled 2017-08-30: qty 1

## 2017-08-30 NOTE — ED Provider Notes (Signed)
[facility] EMERGENCY DEPARTMENT Provider Note   CSN: [id number] Arrival date & time: 08/30/17  1920     History   Chief Complaint Chief Complaint  Patient presents with  . Chest Pain    HPI Rimas Gilham is a 32 y.o. male here presenting with chest pain.  Patient has left-sided chest pain for the last several days.  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  For the last week or so, he noticed that he has progressive left-sided chest pain.   The history is provided by the patient.    History reviewed. No pertinent past medical history.  There are no active problems to display for this patient.   History reviewed. No pertinent surgical history.     Home Medications    Prior to Admission medications   Medication Sig Start Date End Date Taking? Authorizing Provider  naproxen (NAPROSYN) 500 MG tablet Take 1 tablet (500 mg total) by mouth 2 (two) times daily as needed (for knee pain). 11/30/16   Molpus, John, MD    Family History No family history on file.  Social History Social History   Tobacco Use  . Smoking status: Never Smoker  . Smokeless tobacco: Never Used  Substance Use Topics  . Alcohol use: No  . Drug use: No     Allergies   Penicillins and Bactrim [sulfamethoxazole-trimethoprim]   Review of Systems Review of Systems  Cardiovascular: Positive for chest pain.  All other systems reviewed and are negative.    Physical Exam Updated Vital Signs BP 122/87 (BP Location: Left Arm)   Pulse 98   Temp 98.7 F (37.1 C) (Oral)   Resp (!) 24   Ht 6' (1.829 m)   Wt 117.9 kg (260 lb)   SpO2 100%   BMI 35.26 kg/m   Physical Exam  Constitutional: He appears well-developed.  HENT:  Head: Normocephalic.  Eyes: Pupils are equal, round, and reactive to light.  Neck: Normal range of motion.  Cardiovascular: Regular rhythm and normal pulses.  Pulmonary/Chest: Effort normal and breath sounds normal.  Reproducible  tenderness L chest.   Abdominal: Soft.  Musculoskeletal: Normal range of motion.       Right lower leg: Normal.  Neurological: He is alert.  Skin: Skin is warm. Capillary refill takes less than 2 seconds.  Psychiatric: He has a normal mood and affect.  Nursing note and vitals reviewed.    ED Treatments / Results  Labs (all labs ordered are listed, but only abnormal results are displayed) Labs Reviewed - No data to display  EKG  EKG Interpretation  Date/Time:  Wednesday August 30 2017 19:26:00 EST Ventricular Rate:  102 PR Interval:  140 QRS Duration: 78 QT Interval:  360 QTC Calculation: 469 R Axis:   69 Text Interpretation:  Sinus tachycardia Otherwise normal ECG No previous ECGs available Confirmed by Richardean Canal ([id number]) on 08/30/2017 9:18:48 PM       Radiology Dg Chest 2 View  Result Date: 08/30/2017 CLINICAL DATA:  Left-sided chest pain x1 week with dyspnea on exertion. EXAM: CHEST  2 VIEW COMPARISON:  05/19/2016 FINDINGS: The heart size and mediastinal contours are within normal limits. Both lungs are clear. The visualized skeletal structures are unremarkable. IMPRESSION: No active cardiopulmonary disease. Electronically Signed   By: Tollie Eth M.D.   On: 08/30/2017 20:17    Procedures Procedures (including critical care time)  Medications Ordered in ED Medications  cyclobenzaprine (FLEXERIL) tablet 5 mg (not  administered)  ibuprofen (ADVIL,MOTRIN) tablet 800 mg (not administered)     Initial Impression / Assessment and Plan / ED Course  I have reviewed the triage vital signs and the nursing notes.  Pertinent labs & imaging results that were available during my care of the patient were reviewed by me and considered in my medical decision making (see chart for details).    Corwin LevinsBrandon Galan is a 32 y.o. male here with chest pain. Reproducible tenderness L chest. He does lift up heavy items. I think likely chest wall pain. EKG unremarkable. CXR clear. I doubt  ACS or PE. Will dc home with motrin, flexeril, tramadol    Final Clinical Impressions(s) / ED Diagnoses   Final diagnoses:  None    ED Discharge Orders    None       Charlynne PanderYao, Gladine Plude Hsienta, MD 08/30/17 2135

## 2017-08-30 NOTE — Discharge Instructions (Signed)
Continue taking motrin 800 mg every 6 hrs for pain.   Take flexeril for muscle spasms   Take tramadol for severe pain.   See your doctor  Return to ER if you have worse chest pain, trouble breathing, fever.

## 2017-08-30 NOTE — ED Notes (Signed)
Pt verbalizes understanding of dc instructions and denies any further needs at this time.  He understands that he is not to drive on the prescriptions or the flexeril that was given in the department.

## 2017-08-30 NOTE — ED Triage Notes (Addendum)
C/o cp off and on x 11/2 weeks,  Increased w movement radiating to left shoulder w nausea at times  Some relief w ibu

## 2017-09-08 ENCOUNTER — Other Ambulatory Visit: Payer: Self-pay

## 2017-09-08 ENCOUNTER — Emergency Department (HOSPITAL_BASED_OUTPATIENT_CLINIC_OR_DEPARTMENT_OTHER)
Admission: EM | Admit: 2017-09-08 | Discharge: 2017-09-08 | Disposition: A | Discharge status: Home / Self Care | Payer: BLUE CROSS/BLUE SHIELD | Attending: Emergency Medicine | Admitting: Emergency Medicine

## 2017-09-08 ENCOUNTER — Encounter (HOSPITAL_BASED_OUTPATIENT_CLINIC_OR_DEPARTMENT_OTHER): Payer: Self-pay | Admitting: *Deleted

## 2017-09-08 DIAGNOSIS — R0789 Other chest pain: Secondary | ICD-10-CM

## 2017-09-08 DIAGNOSIS — Z79899 Other long term (current) drug therapy: Secondary | ICD-10-CM | POA: Insufficient documentation

## 2017-09-08 DIAGNOSIS — M25512 Pain in left shoulder: Secondary | ICD-10-CM | POA: Diagnosis not present

## 2017-09-08 MED ORDER — METHOCARBAMOL 500 MG PO TABS: 500.0000 mg | ORAL_TABLET | Freq: Two times a day (BID) | ORAL | 0 refills | Status: DC

## 2017-09-08 MED FILL — METHOCARBAMOL 500 MG TABLET: 500 | 10 days supply | Qty: 20 | Fill #0
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
## 2017-09-08 NOTE — ED Notes (Signed)
ED Provider at bedside. 

## 2017-09-08 NOTE — Discharge Instructions (Signed)
Your exam is reassuring today.  Please use shoulder sling for comfort.  Please take your shoulder out of the shoulder sling and perform shoulder range of motion exercises at least 1 time per day.  I provided you with the note for work.  I have also provided you with a different muscle relaxer to try and replace of Flexeril.  Please do not combine these medications.  Please follow-up with your orthopedist next Wednesday.  If your pain becomes exertional or is associated with sweating, shortness of breath, coughing up blood or nausea please return for reevaluation.

## 2017-09-08 NOTE — ED Notes (Signed)
NAD at this time. Pt is stable and going home.  
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
## 2017-09-08 NOTE — ED Provider Notes (Signed)
[facility] EMERGENCY DEPARTMENT Provider Note   CSN: [id number] Arrival date & time: 09/08/17  1325     History   Chief Complaint Chief Complaint  Patient presents with  . Shoulder Injury    HPI Richard Orr is a 32 y.o. male no significant past medical history who presents the emergency department today for left upper chest and left shoulder pain for the last several weeks. 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 He started noticing that he was becoming sore during these activities in his left upper chest and shoulder a few weeks ago and then pain then became constant regardless if he was working. He was seen here for this on 08/30/17 where he was diagnosed with chest wall pain and discharged home on flexeril and naproxen. At that time had reassuring chest xray and ekg. The patient notes that he was sent home with a few days off and several days of restrictions of <25 lbs for moving things that helped with the patient's symptoms. He notes the restrictions have now been lifted and he is required to lift heavy things again which is making his symptoms worse. He is coming in today to get another note for work. He has a follow up with [facility] for next thursday. He has been taking flexeril and naproxen for his pain, with moderate relief. He denies any associated chest pain with walking, or exertion except with lifting. No SOB, DOE, orthopnea, cough, diaphoresis, nausea, or vomiting. The pain is not worse with inspiration. Denies risk factors for DVT/PE including exogenous estrogen/ testosterone use, recent surgery or travel, trauma, immobilization, smoking, previous blood clot, cough, hemoptysis, cancer, lower extremity pain or swelling, or family history of bleeding/clotting disorder. Triage note states the patient was in [facility] but patient denies this.   HPI  History reviewed. No pertinent past medical history.  There  are no active problems to display for this patient.   History reviewed. No pertinent surgical history.     Home Medications    Prior to Admission medications   Medication Sig Start Date End Date Taking? Authorizing Provider  cyclobenzaprine (FLEXERIL) 5 MG tablet Take 1 tablet (5 mg total) by mouth 3 (three) times daily as needed for muscle spasms. 08/30/17   Charlynne Pander, MD  naproxen (NAPROSYN) 500 MG tablet Take 1 tablet (500 mg total) by mouth 2 (two) times daily as needed (for knee pain). 11/30/16   Molpus, John, MD  traMADol (ULTRAM) 50 MG tablet Take 1 tablet (50 mg total) by mouth every 6 (six) hours as needed. 08/30/17   Charlynne Pander, MD    Family History No family history on file.  Social History Social History   Tobacco Use  . Smoking status: Never Smoker  . Smokeless tobacco: Never Used  Substance Use Topics  . Alcohol use: No  . Drug use: No     Allergies   Penicillins and Bactrim [sulfamethoxazole-trimethoprim]   Review of Systems Review of Systems  All other systems reviewed and are negative.    Physical Exam Updated Vital Signs BP 119/73   Pulse 81   Temp 97.8 F (36.6 C) (Oral)   Resp (!) 22   Ht 6' (1.829 m)   Wt 117.9 kg (260 lb)   SpO2 100%   BMI 35.26 kg/m   Physical Exam  Constitutional: He appears well-developed and well-nourished.  HENT:  Head: Normocephalic and atraumatic.  Right Ear: External ear normal.  Left Ear: External ear normal.  Nose: Nose normal.  Mouth/Throat: Uvula is midline, oropharynx is clear and moist and mucous membranes are normal. No tonsillar exudate.  Eyes: Pupils are equal, round, and reactive to light. Right eye exhibits no discharge. Left eye exhibits no discharge. No scleral icterus.  Neck: Trachea normal. Neck supple. No JVD present. No spinous process tenderness present. Carotid bruit is not present. No neck rigidity. Normal range of motion present.  Cardiovascular: Normal rate, regular  rhythm and intact distal pulses.  No murmur heard. Pulses:      Radial pulses are 2+ on the right side, and 2+ on the left side.       Dorsalis pedis pulses are 2+ on the right side, and 2+ on the left side.       Posterior tibial pulses are 2+ on the right side, and 2+ on the left side.  No lower extremity swelling or edema. Calves symmetric in size bilaterally.  Pulmonary/Chest: Effort normal and breath sounds normal. He exhibits tenderness. He exhibits no crepitus.    Musculoskeletal: He exhibits no edema.   Left Shoulder: Appearance normal. No obvious bony deformity. No skin swelling, erythema, heat, fluctuance or break of the skin. No clavicular deformity or TTP. TTP over left pectoralis major into insertion site on humerus. There is also TTP over the bicipital groove of the left shoulder. Patient with pain during resisted internal rotation and flexion. ROM intact. Strength appropriate for age. Negative drop arm test. Equivocal speeds test.   Radial Pulse 2+. Cap refill <2 seconds. SILT for M/U/R distributions. Compartments soft.   Lymphadenopathy:    He has no cervical adenopathy.  Neurological: He is alert.  Skin: Skin is warm and dry. Capillary refill takes less than 2 seconds. No rash noted. He is not diaphoretic. No erythema.  Psychiatric: He has a normal mood and affect.  Nursing note and vitals reviewed.    ED Treatments / Results  Labs (all labs ordered are listed, but only abnormal results are displayed) Labs Reviewed - No data to display  EKG  EKG Interpretation None       Radiology No results found.  Procedures Procedures (including critical care time)  Medications Ordered in ED Medications - No data to display   Initial Impression / Assessment and Plan / ED Course  I have reviewed the triage vital signs and the nursing notes.  Pertinent labs & imaging results that were available during my care of the patient were reviewed by me and considered in my  medical decision making (see chart for details).     32 y.o. male presenting with left chest wall and left shoulder pain that is worse with lifting large, heavy objects.  He was seen here on 2/13 where he had reassuring chest x-ray and EKG.  He was sent home on Flexeril, naproxen and modification of job duties.  He notes that his note has run out and he is requesting another note for work for activity modification until he is able to follow with Dr. Aundria Rud of [facility] next Thursday.  Patient's exam is consistent with pectoralis major strain question of proximal biceps pathology.  He is neurovascular intact and compartments are soft.  Vital signs are reassuring.  Exam not concerning for septic joint of the left shoulder.  Given history and exam findings low suspicion for ACS or PE at this time.  Patient is PERC negative.  Will give the patient a  shoulder sling for comfort.  Advised him to take his shoulder out of the sling at least 1 time per day and perform shoulder range of motion exercises and prevent frozen shoulder.  Will give note for work.  Patient is a follow-up with orthopedics next week.  Return precautions discussed.  Patient appears safe for discharge.  Patient case discussed with Dr. Dalene SeltzerSchlossman who is in agreement with plan.  Final Clinical Impressions(s) / ED Diagnoses   Final diagnoses:  Chest wall pain    ED Discharge Orders        Ordered    methocarbamol (ROBAXIN) 500 MG tablet  2 times daily     09/08/17 1438       Princella PellegriniMaczis, Kaela Beitz M, PA-C 09/08/17 1704    Alvira MondaySchlossman, Erin, MD 09/10/17 [id number]

## 2017-09-12 ENCOUNTER — Telehealth (HOSPITAL_BASED_OUTPATIENT_CLINIC_OR_DEPARTMENT_OTHER): Payer: Self-pay | Admitting: Emergency Medicine

## 2018-11-21 DIAGNOSIS — K219 Gastro-esophageal reflux disease without esophagitis: Secondary | ICD-10-CM | POA: Insufficient documentation

## 2019-04-13 IMAGING — CR DG CHEST 2V
2 series · 2 of 2 positions shown · non-contrast
Comparison: 05/19/2016

CLINICAL DATA: Left-sided chest pain x1 week with dyspnea on
exertion.

EXAM:
CHEST  2 VIEW

[w chest pa]
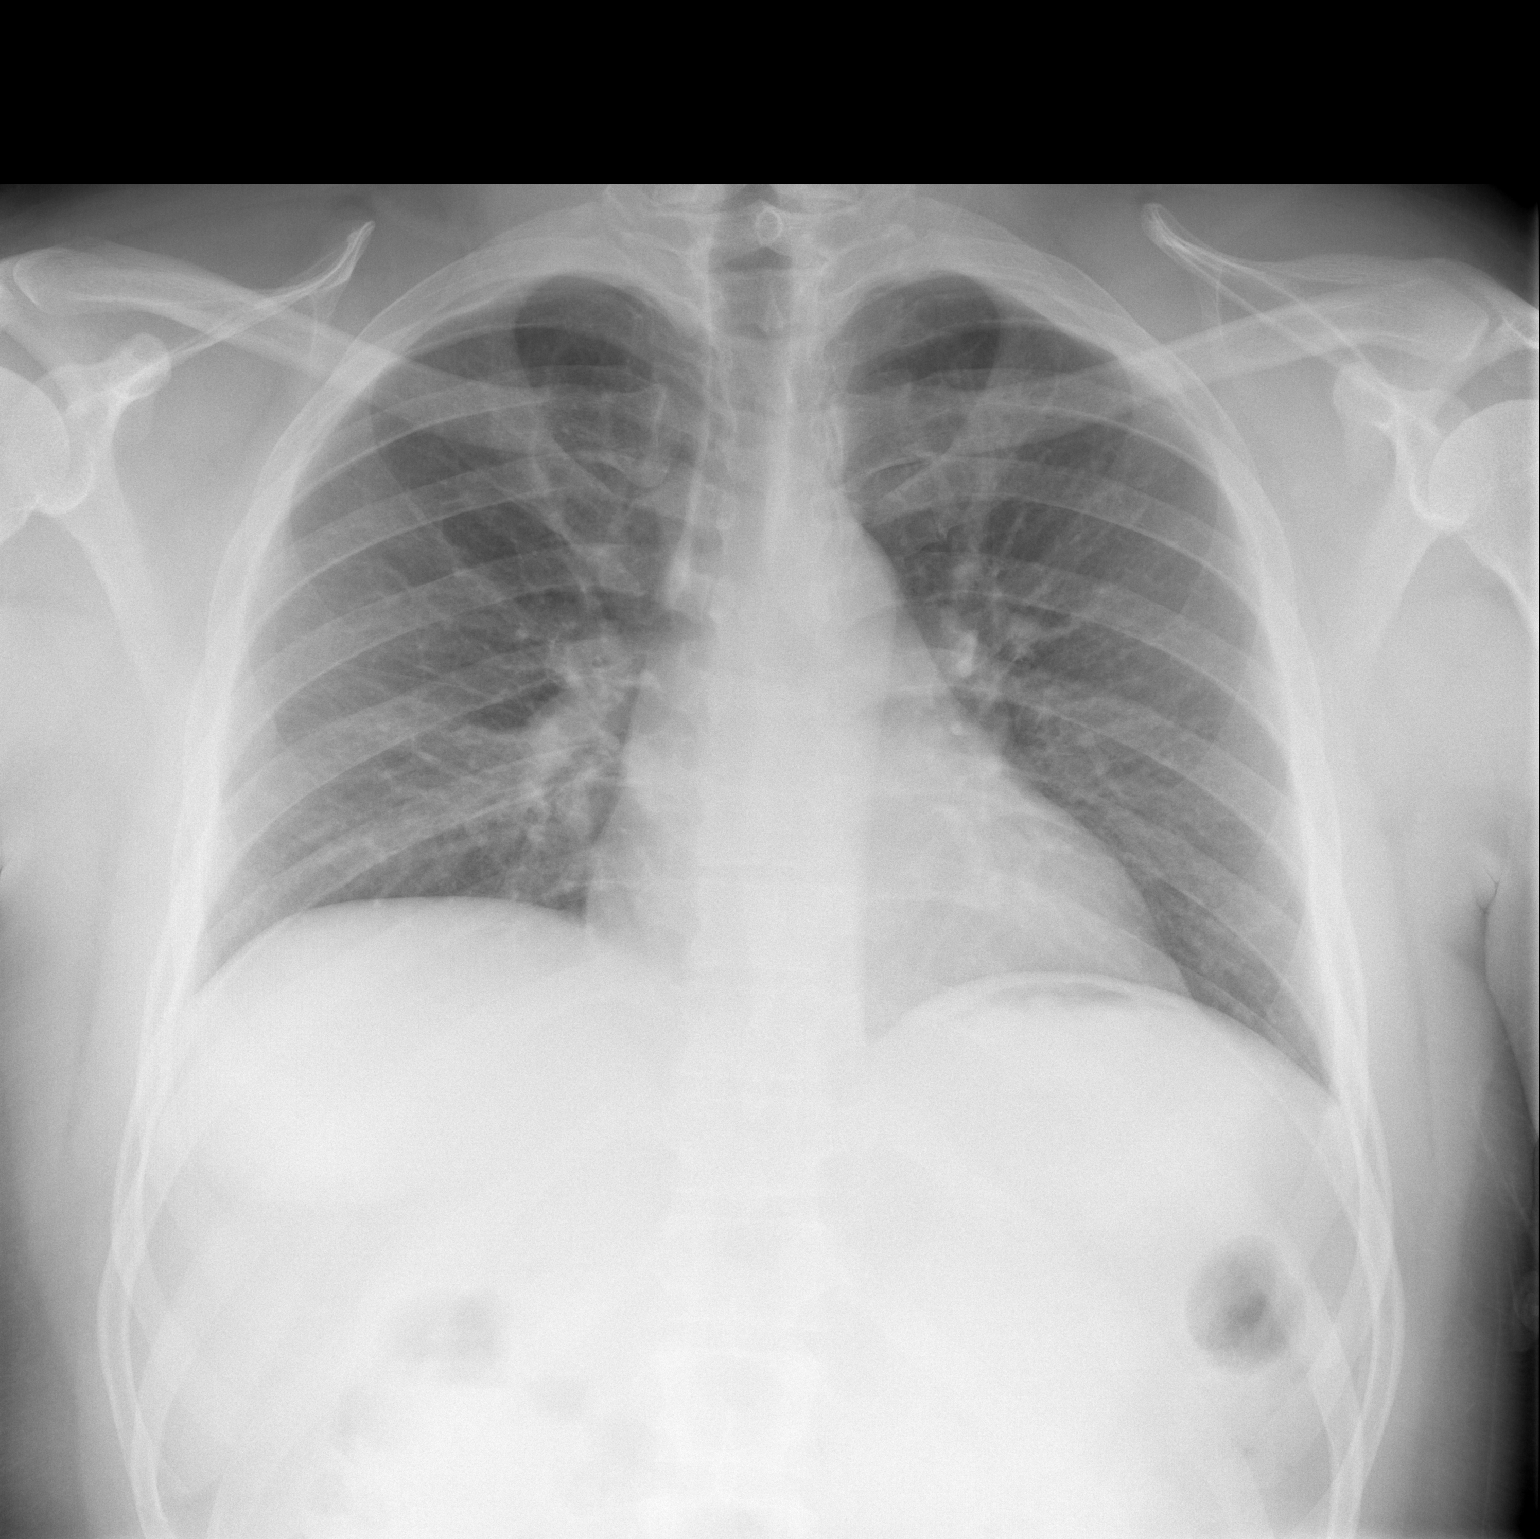

[w chest lat]
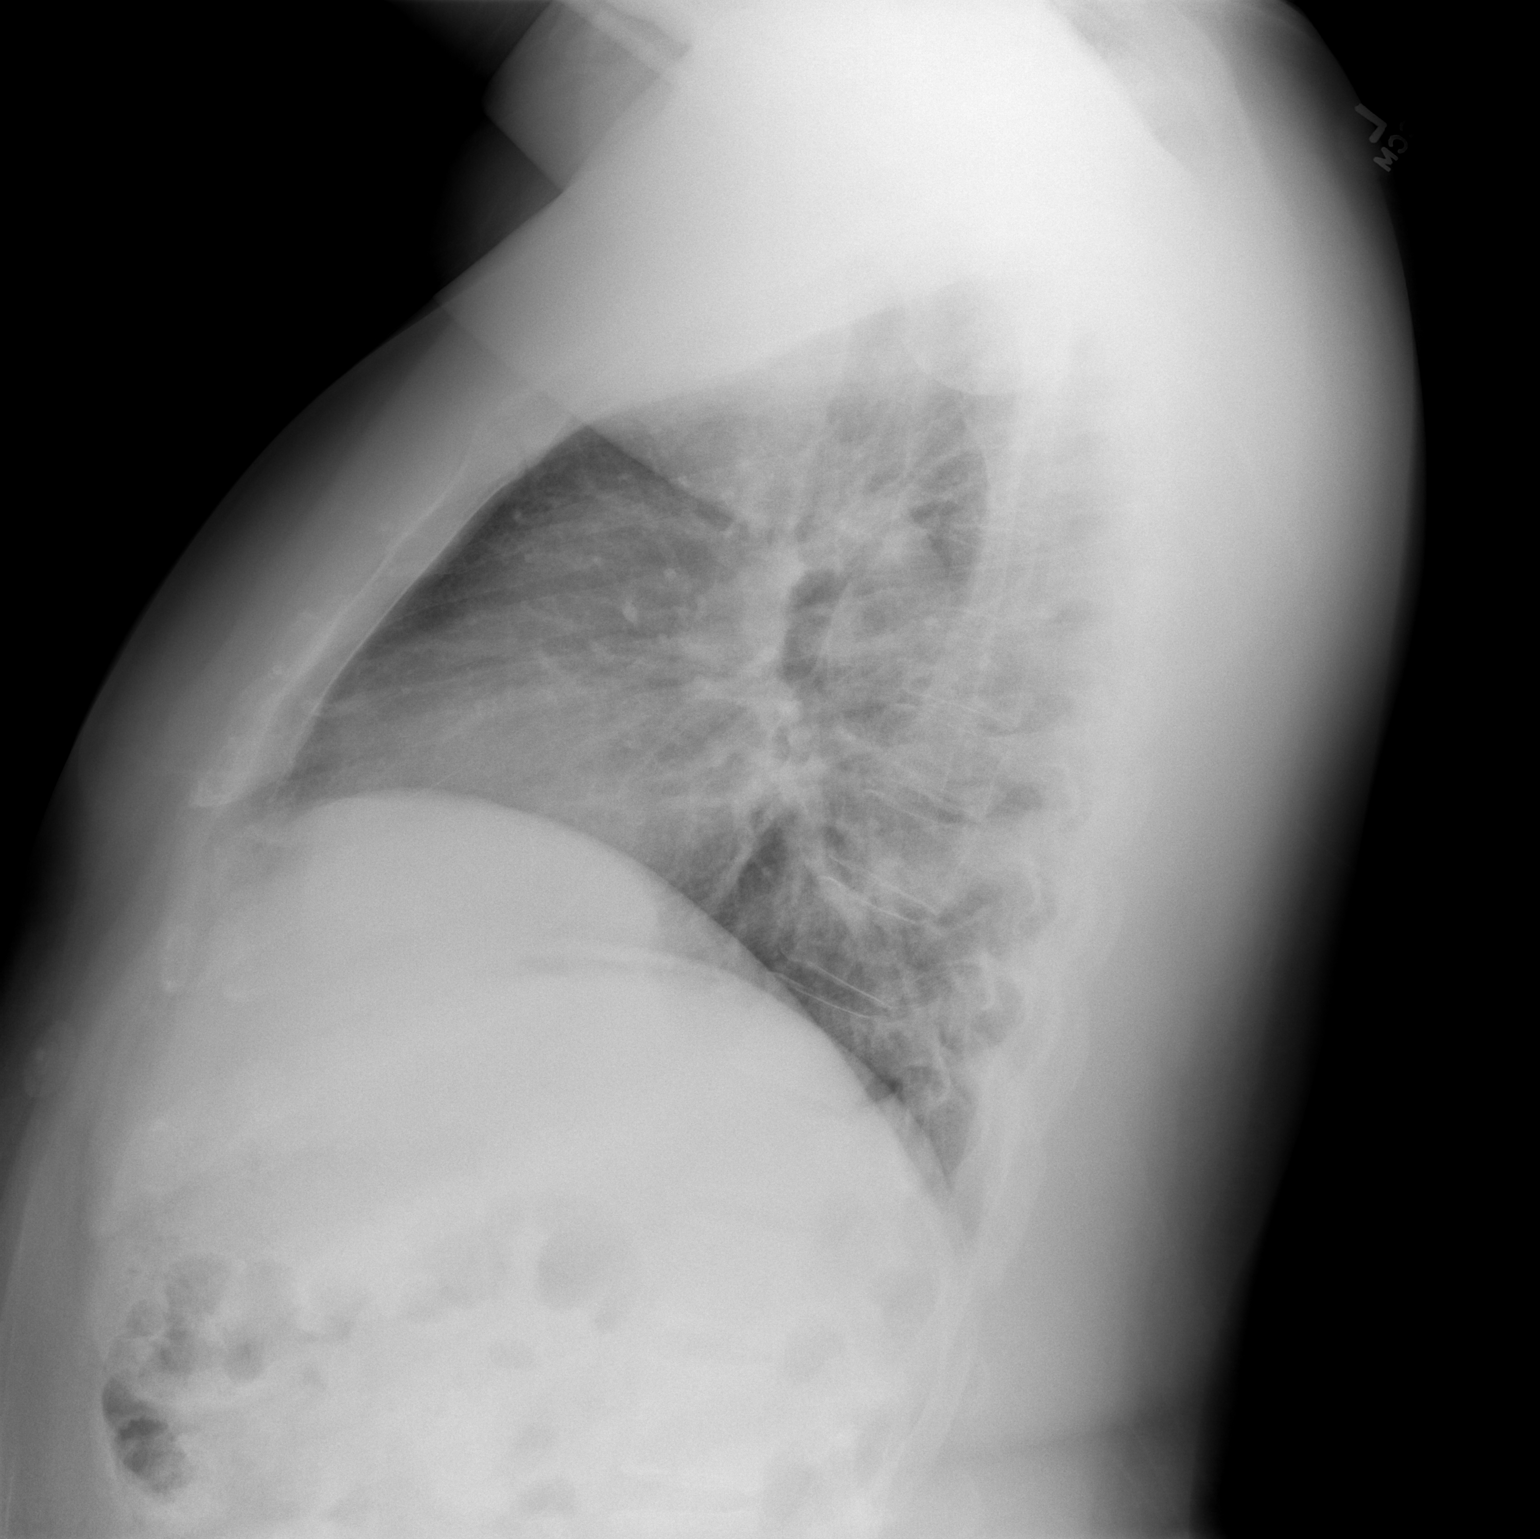

[2 of 2 positions shown; findings below may reference images not displayed]

FINDINGS: The heart size and mediastinal contours are within normal limits.
Both lungs are clear. The visualized skeletal structures are
unremarkable.
IMPRESSION: No active cardiopulmonary disease.

## 2019-09-28 ENCOUNTER — Encounter (HOSPITAL_COMMUNITY): Payer: Self-pay

## 2019-09-28 ENCOUNTER — Other Ambulatory Visit: Payer: Self-pay

## 2019-09-28 ENCOUNTER — Emergency Department (HOSPITAL_COMMUNITY)
Admission: EM | Admit: 2019-09-28 | Discharge: 2019-09-29 | Disposition: A | Discharge status: Home / Self Care | Payer: BC Managed Care – PPO | Attending: Emergency Medicine | Admitting: Emergency Medicine

## 2019-09-28 ENCOUNTER — Emergency Department (HOSPITAL_COMMUNITY): Payer: BC Managed Care – PPO

## 2019-09-28 DIAGNOSIS — Z79899 Other long term (current) drug therapy: Secondary | ICD-10-CM | POA: Insufficient documentation

## 2019-09-28 DIAGNOSIS — R000 Tachycardia, unspecified: Secondary | ICD-10-CM | POA: Insufficient documentation

## 2019-09-28 DIAGNOSIS — M62830 Muscle spasm of back: Secondary | ICD-10-CM

## 2019-09-28 DIAGNOSIS — M549 Dorsalgia, unspecified: Secondary | ICD-10-CM | POA: Diagnosis present

## 2019-09-28 MED ORDER — METHOCARBAMOL 500 MG PO TABS
1000.0000 mg | ORAL_TABLET | Freq: Once | ORAL | Status: AC
  Administered 2019-09-28: 1000 mg via ORAL
  Filled 2019-09-28: qty 2

## 2019-09-28 MED ORDER — OXYCODONE-ACETAMINOPHEN 5-325 MG PO TABS
2.0000 | ORAL_TABLET | Freq: Once | ORAL | Status: AC
  Administered 2019-09-28: 2 via ORAL
  Filled 2019-09-28: qty 2

## 2019-09-28 NOTE — ED Provider Notes (Signed)
[facility]-EMERGENCY DEPT Provider Note   CSN: [id number] Arrival date & time: 09/28/19  2142     History Chief Complaint  Patient presents with  . Back Pain    Richard Orr is a 34 y.o. male.  Patient to ED with complaint of sudden onset, severe bilateral mid-back pain this afternoon while at rest. He reports movement, sitting up, deep breathing make the pain worse. He is only comfortable lying on his stomach. No chest pain, abdominal pain, fever, nausea, cough or congestion. He is a nonsmoker. No history of asthma. He took a single ibuprofen at home without relief. He states he had similar symptoms years ago diagnosed as a muscle spasm.   The history is provided by the patient. No language interpreter was used.  Back Pain Associated symptoms: no abdominal pain, no chest pain and no fever        History reviewed. No pertinent past medical history.  There are no problems to display for this patient.   History reviewed. No pertinent surgical history.     History reviewed. No pertinent family history.  Social History   Tobacco Use  . Smoking status: Never Smoker  . Smokeless tobacco: Never Used  Substance Use Topics  . Alcohol use: No  . Drug use: No    Home Medications Prior to Admission medications   Medication Sig Start Date End Date Taking? Authorizing Provider  cyclobenzaprine (FLEXERIL) 5 MG tablet Take 1 tablet (5 mg total) by mouth 3 (three) times daily as needed for muscle spasms. 08/30/17   Charlynne Pander, MD  methocarbamol (ROBAXIN) 500 MG tablet Take 1 tablet (500 mg total) by mouth 2 (two) times daily. 09/08/17   Maczis, Elmer Sow, PA-C  naproxen (NAPROSYN) 500 MG tablet Take 1 tablet (500 mg total) by mouth 2 (two) times daily as needed (for knee pain). 11/30/16   Molpus, John, MD  traMADol (ULTRAM) 50 MG tablet Take 1 tablet (50 mg total) by mouth every 6 (six) hours as needed. 08/30/17   Charlynne Pander, MD    Allergies     Penicillins and Bactrim [sulfamethoxazole-trimethoprim]  Review of Systems   Review of Systems  Constitutional: Negative for fever.  HENT: Negative for congestion.   Respiratory: Negative for cough and shortness of breath.        Back pain with respirations  Cardiovascular: Negative for chest pain.  Gastrointestinal: Negative for abdominal pain and nausea.  Musculoskeletal: Positive for back pain.    Physical Exam Updated Vital Signs BP 135/85 (BP Location: Left Arm)   Pulse 97   Temp 97.6 F (36.4 C) (Oral)   Resp 18   SpO2 94%   Physical Exam Vitals and nursing note reviewed.  Constitutional:      Appearance: He is well-developed.  HENT:     Head: Normocephalic.  Cardiovascular:     Rate and Rhythm: Normal rate and regular rhythm.  Pulmonary:     Effort: Pulmonary effort is normal.     Breath sounds: Normal breath sounds.     Comments: Shallow respirations Chest:     Chest wall: No tenderness.  Abdominal:     General: Bowel sounds are normal.     Palpations: Abdomen is soft.     Tenderness: There is no abdominal tenderness. There is no guarding or rebound.  Musculoskeletal:        General: Normal range of motion.     Cervical back: Normal range of motion and neck supple.  Back:     Comments: Back is tender to palpation  Skin:    General: Skin is warm and dry.     Findings: No rash.  Neurological:     Mental Status: He is alert.     Cranial Nerves: No cranial nerve deficit.     ED Results / Procedures / Treatments   Labs (all labs ordered are listed, but only abnormal results are displayed) Labs Reviewed - No data to display  EKG None  Radiology No results found.  Procedures Procedures (including critical care time)  Medications Ordered in ED Medications  oxyCODONE-acetaminophen (PERCOCET/ROXICET) 5-325 MG per tablet 2 tablet (has no administration in time range)  methocarbamol (ROBAXIN) tablet 1,000 mg (has no administration in time  range)    ED Course  I have reviewed the triage vital signs and the nursing notes.  Pertinent labs & imaging results that were available during my care of the patient were reviewed by me and considered in my medical decision making (see chart for details).    MDM Rules/Calculators/A&P                      Patient to ED with bilateral mid-back pain as detailed in the HPI.   He is tachycardic on arrival with O2 sat 93%. He is nontoxic in appearance. Appears uncomfortable with movement. Remains lying on his stomach.   CXR with ?infiltrate vs atx. No fever, cough or congestion - doubt infection. However, pleuritic chest pain, tachycardia, suboptimal O2 saturation, sudden onset pain - will obtain labs including d-dimer.   Patient feeling better with medications. D-dimer and labs are WNL, CXR clear, suggesting pain is musculoskeletal.  He can be discharged home with supportive medications.  Final Clinical Impression(s) / ED Diagnoses Final diagnoses:  None   1. Musculoskeletal back pain   Rx / DC Orders ED Discharge Orders    None       Charlann Lange, Hershal Coria 09/29/19 0328    Palumbo, April, MD 09/29/19 [phone]

## 2019-09-28 NOTE — ED Triage Notes (Signed)
Pt reports upper mid back pain. States that it started hurting after he woke up from a nap. Only comfortable on his stomach. States that pain is worse with a deep breath.

## 2019-09-28 NOTE — ED Notes (Signed)
Patient reports he is able to ambulate but he has pain with each step.

## 2019-09-29 LAB — CBC WITH DIFFERENTIAL/PLATELET
Abs Immature Granulocytes: 0.06 10*3/uL (ref 0.00–0.07)
Basophils Absolute: 0 10*3/uL (ref 0.0–0.1)
Basophils Relative: 0 %
Eosinophils Absolute: 0.1 10*3/uL (ref 0.0–0.5)
Eosinophils Relative: 2 %
HCT: 43.8 % (ref 39.0–52.0)
Hemoglobin: 14.3 g/dL (ref 13.0–17.0)
Immature Granulocytes: 1 %
Lymphocytes Relative: 34 %
Lymphs Abs: 2.5 10*3/uL (ref 0.7–4.0)
MCH: 28.1 pg (ref 26.0–34.0)
MCHC: 32.6 g/dL (ref 30.0–36.0)
MCV: 86.2 fL (ref 80.0–100.0)
Monocytes Absolute: 0.7 10*3/uL (ref 0.1–1.0)
Monocytes Relative: 10 %
Neutro Abs: 3.9 10*3/uL (ref 1.7–7.7)
Neutrophils Relative %: 53 %
Platelets: 281 10*3/uL (ref 150–400)
RBC: 5.08 MIL/uL (ref 4.22–5.81)
RDW: 13.2 % (ref 11.5–15.5)
WBC: 7.3 10*3/uL (ref 4.0–10.5)
nRBC: 0 % (ref 0.0–0.2)

## 2019-09-29 LAB — COMPREHENSIVE METABOLIC PANEL
ALT: 29 U/L (ref 0–44)
AST: 22 U/L (ref 15–41)
Albumin: 3.8 g/dL (ref 3.5–5.0)
Alkaline Phosphatase: 105 U/L (ref 38–126)
Anion gap: 10 (ref 5–15)
BUN: 9 mg/dL (ref 6–20)
CO2: 22 mmol/L (ref 22–32)
Calcium: 8.9 mg/dL (ref 8.9–10.3)
Chloride: 105 mmol/L (ref 98–111)
Creatinine, Ser: 1 mg/dL (ref 0.61–1.24)
GFR calc Af Amer: >60 mL/min (ref >60)
GFR calc non Af Amer: >60 mL/min (ref >60)
Glucose, Bld: 105 mg/dL — ABNORMAL HIGH (ref 70–99)
Potassium: 3.5 mmol/L (ref 3.5–5.1)
Sodium: 137 mmol/L (ref 135–145)
Total Bilirubin: 0.6 mg/dL (ref 0.3–1.2)
Total Protein: 8 g/dL (ref 6.5–8.1)

## 2019-09-29 LAB — D-DIMER, QUANTITATIVE (NOT AT ARMC): D-Dimer, Quant: 0.39 ug/mL-FEU (ref 0.00–0.50)

## 2019-09-29 MED ORDER — METHOCARBAMOL 500 MG PO TABS: 500.0000 mg | ORAL_TABLET | Freq: Two times a day (BID) | ORAL | 0 refills | Status: AC

## 2019-09-29 MED ORDER — IBUPROFEN 600 MG PO TABS: 600.0000 mg | ORAL_TABLET | Freq: Four times a day (QID) | ORAL | 0 refills | Status: AC | PRN

## 2019-09-29 NOTE — ED Notes (Signed)
Patient states the pain medication is working and is now scoring his pain a 1 out of 10.

## 2020-02-24 ENCOUNTER — Emergency Department (HOSPITAL_COMMUNITY)
Admission: EM | Admit: 2020-02-24 | Discharge: 2020-02-24 | Discharge status: Against Medical Advice | Payer: BC Managed Care – PPO

## 2020-02-24 ENCOUNTER — Other Ambulatory Visit: Payer: Self-pay

## 2021-03-29 DIAGNOSIS — R059 Cough, unspecified: Secondary | ICD-10-CM | POA: Diagnosis not present

## 2021-03-29 DIAGNOSIS — U071 COVID-19: Secondary | ICD-10-CM | POA: Diagnosis not present

## 2021-04-02 DIAGNOSIS — U071 COVID-19: Secondary | ICD-10-CM | POA: Diagnosis not present

## 2021-04-23 DIAGNOSIS — M7731 Calcaneal spur, right foot: Secondary | ICD-10-CM | POA: Diagnosis not present

## 2021-04-23 DIAGNOSIS — M79671 Pain in right foot: Secondary | ICD-10-CM | POA: Diagnosis not present

## 2021-04-23 DIAGNOSIS — M79676 Pain in unspecified toe(s): Secondary | ICD-10-CM | POA: Diagnosis not present

## 2021-04-23 DIAGNOSIS — E782 Mixed hyperlipidemia: Secondary | ICD-10-CM | POA: Diagnosis not present

## 2021-05-11 DIAGNOSIS — M2041 Other hammer toe(s) (acquired), right foot: Secondary | ICD-10-CM | POA: Diagnosis not present

## 2021-05-11 DIAGNOSIS — L84 Corns and callosities: Secondary | ICD-10-CM | POA: Diagnosis not present

## 2021-06-13 DIAGNOSIS — Z20822 Contact with and (suspected) exposure to covid-19: Secondary | ICD-10-CM | POA: Diagnosis not present

## 2021-06-22 DIAGNOSIS — M2041 Other hammer toe(s) (acquired), right foot: Secondary | ICD-10-CM | POA: Diagnosis not present

## 2021-06-22 DIAGNOSIS — M898X9 Other specified disorders of bone, unspecified site: Secondary | ICD-10-CM | POA: Diagnosis not present

## 2021-07-22 DIAGNOSIS — M25774 Osteophyte, right foot: Secondary | ICD-10-CM | POA: Diagnosis not present

## 2021-07-22 DIAGNOSIS — M898X7 Other specified disorders of bone, ankle and foot: Secondary | ICD-10-CM | POA: Diagnosis not present

## 2021-07-22 DIAGNOSIS — M2041 Other hammer toe(s) (acquired), right foot: Secondary | ICD-10-CM | POA: Diagnosis not present

## 2021-08-31 DIAGNOSIS — Z4789 Encounter for other orthopedic aftercare: Secondary | ICD-10-CM | POA: Diagnosis not present

## 2021-09-24 DIAGNOSIS — Z6841 Body Mass Index (BMI) 40.0 and over, adult: Secondary | ICD-10-CM | POA: Diagnosis not present

## 2021-12-14 ENCOUNTER — Encounter (HOSPITAL_BASED_OUTPATIENT_CLINIC_OR_DEPARTMENT_OTHER): Payer: Self-pay | Admitting: Emergency Medicine

## 2021-12-14 ENCOUNTER — Other Ambulatory Visit: Payer: Self-pay

## 2021-12-14 ENCOUNTER — Emergency Department (HOSPITAL_BASED_OUTPATIENT_CLINIC_OR_DEPARTMENT_OTHER)
Admission: EM | Admit: 2021-12-14 | Discharge: 2021-12-15 | Disposition: A | Discharge status: Home / Self Care | Payer: BC Managed Care – PPO | Attending: Emergency Medicine | Admitting: Emergency Medicine

## 2021-12-14 DIAGNOSIS — J069 Acute upper respiratory infection, unspecified: Secondary | ICD-10-CM | POA: Insufficient documentation

## 2021-12-14 DIAGNOSIS — R6889 Other general symptoms and signs: Secondary | ICD-10-CM

## 2021-12-14 DIAGNOSIS — M791 Myalgia, unspecified site: Secondary | ICD-10-CM | POA: Insufficient documentation

## 2021-12-14 DIAGNOSIS — R0981 Nasal congestion: Secondary | ICD-10-CM | POA: Insufficient documentation

## 2021-12-14 DIAGNOSIS — Z20822 Contact with and (suspected) exposure to covid-19: Secondary | ICD-10-CM | POA: Insufficient documentation

## 2021-12-14 DIAGNOSIS — J101 Influenza due to other identified influenza virus with other respiratory manifestations: Secondary | ICD-10-CM | POA: Diagnosis not present

## 2021-12-14 HISTORY — DX: Pure hypercholesterolemia, unspecified: E78.00

## 2021-12-14 NOTE — ED Triage Notes (Signed)
Pt is c/o general body aches, swelling in his legs, headache and pain in his right arm   Pt states his sxs started on Sunday

## 2021-12-15 LAB — RESP PANEL BY RT-PCR (FLU A&B, COVID) ARPGX2
Influenza A by PCR: NEGATIVE
Influenza B by PCR: NEGATIVE
SARS Coronavirus 2 by RT PCR: NEGATIVE

## 2021-12-15 MED ORDER — IBUPROFEN 400 MG PO TABS: 400.0000 mg | ORAL_TABLET | Freq: Four times a day (QID) | ORAL | 0 refills | Status: AC | PRN

## 2021-12-15 MED ORDER — ACETAMINOPHEN 500 MG PO TABS
1000.0000 mg | ORAL_TABLET | Freq: Once | ORAL | Status: AC
  Administered 2021-12-15: 1000 mg via ORAL
  Filled 2021-12-15: qty 2

## 2021-12-15 MED ORDER — IBUPROFEN 400 MG PO TABS
400.0000 mg | ORAL_TABLET | Freq: Once | ORAL | Status: AC
Start: 2021-12-15 — End: 2021-12-15
  Administered 2021-12-15: 400 mg via ORAL
  Filled 2021-12-15: qty 1

## 2021-12-15 NOTE — ED Notes (Signed)
Pt A&Ox4 ambulatory at d/c with independent steady gait. Pt verbalized understanding of d/c instructions, prescription and follow up care. 

## 2021-12-15 NOTE — ED Provider Notes (Signed)
[facility] EMERGENCY DEPARTMENT Provider Note   CSN: [id number] Arrival date & time: 12/14/21  2147     History  Chief Complaint  Patient presents with   Generalized Body Aches    Leg swelling    Richard Orr is a 36 y.o. male.  The history is provided by the patient.  URI Presenting symptoms: congestion   Presenting symptoms: no ear pain, no fever, no rhinorrhea and no sore throat   Presenting symptoms comment:  Body aches and headache  Severity:  Moderate Onset quality:  Gradual Duration:  2 days Timing:  Constant Progression:  Unchanged Chronicity:  New Relieved by:  Nothing Worsened by:  Nothing Ineffective treatments:  None tried Associated symptoms: headaches and myalgias   Associated symptoms: no neck pain, no sneezing, no swollen glands and no wheezing   Risk factors: not elderly   URI symptoms with body aches since Sunday.  No f/c/r.  No leg swelling.  No travel.  No CP, no SOB.      Home Medications Prior to Admission medications   Medication Sig Start Date End Date Taking? Authorizing Provider  cyclobenzaprine (FLEXERIL) 5 MG tablet Take 1 tablet (5 mg total) by mouth 3 (three) times daily as needed for muscle spasms. 08/30/17   Charlynne Pander, MD  ibuprofen (ADVIL) 600 MG tablet Take 1 tablet (600 mg total) by mouth every 6 (six) hours as needed. 09/29/19   Elpidio Anis, PA-C  methocarbamol (ROBAXIN) 500 MG tablet Take 1 tablet (500 mg total) by mouth 2 (two) times daily. 09/29/19   Elpidio Anis, PA-C  naproxen (NAPROSYN) 500 MG tablet Take 1 tablet (500 mg total) by mouth 2 (two) times daily as needed (for knee pain). 11/30/16   Molpus, John, MD  traMADol (ULTRAM) 50 MG tablet Take 1 tablet (50 mg total) by mouth every 6 (six) hours as needed. 08/30/17   Charlynne Pander, MD      Allergies    Penicillins and Bactrim [sulfamethoxazole-trimethoprim]    Review of Systems   Review of Systems  Constitutional:  Negative for fever.  HENT:   Positive for congestion. Negative for ear pain, rhinorrhea, sneezing and sore throat.   Eyes:  Negative for redness.  Respiratory:  Negative for shortness of breath, wheezing and stridor.   Cardiovascular:  Negative for chest pain and leg swelling.  Gastrointestinal:  Negative for abdominal pain.  Musculoskeletal:  Positive for myalgias. Negative for neck pain.  Skin:  Negative for rash.  Neurological:  Positive for headaches.  All other systems reviewed and are negative.  Physical Exam Updated Vital Signs BP 138/87   Pulse 77   Temp 98.7 F (37.1 C) (Oral)   Resp 18   Ht 6\' 2"  (1.88 m)   Wt 115.2 kg   SpO2 96%   BMI 32.61 kg/m  Physical Exam Vitals and nursing note reviewed.  Constitutional:      General: He is not in acute distress.    Appearance: He is well-developed.  HENT:     Head: Normocephalic and atraumatic.     Nose: Congestion present.  Eyes:     Comments: Normal appearance  Cardiovascular:     Rate and Rhythm: Normal rate and regular rhythm.  Pulmonary:     Effort: Pulmonary effort is normal. No respiratory distress.     Breath sounds: Normal breath sounds.  Abdominal:     General: Bowel sounds are normal. There is no distension.     Palpations: Abdomen  is soft. There is no mass.     Tenderness: There is no abdominal tenderness. There is no guarding or rebound.  Genitourinary:    Comments: No CVA tenderness Musculoskeletal:        General: No tenderness. Normal range of motion.     Cervical back: Normal range of motion.     Right lower leg: No edema.     Left lower leg: No edema.  Skin:    General: Skin is warm and dry.     Capillary Refill: Capillary refill takes less than 2 seconds.     Findings: No rash.  Neurological:     General: No focal deficit present.     Mental Status: He is alert and oriented to person, place, and time.     Deep Tendon Reflexes: Reflexes normal.  Psychiatric:        Mood and Affect: Mood normal.        Behavior:  Behavior normal.    ED Results / Procedures / Treatments   Labs (all labs ordered are listed, but only abnormal results are displayed) Labs Reviewed  RESP PANEL BY RT-PCR (FLU A&B, COVID) ARPGX2    EKG None  Radiology No results found.  Procedures Procedures    Medications Ordered in ED Medications  acetaminophen (TYLENOL) tablet 1,000 mg (1,000 mg Oral Given 12/15/21 0021)  ibuprofen (ADVIL) tablet 400 mg (400 mg Oral Given 12/15/21 0021)    ED Course/ Medical Decision Making/ A&P                           Medical Decision Making URI symptoms for 2 days.    Amount and/or Complexity of Data Reviewed External Data Reviewed: notes.    Details: previous notes reviewed. Labs: ordered.    Details: negative covid and flu  Risk OTC drugs. Prescription drug management. Risk Details: Viral uri.  No swelling.  Alternate tylenol and ibuprofen strict return precautions given.       Final Clinical Impression(s) / ED Diagnoses Final diagnoses:  None   Return for intractable cough, coughing up blood, fevers > 100.4 unrelieved by medication, shortness of breath, intractable vomiting, chest pain, shortness of breath, weakness, numbness, changes in speech, facial asymmetry, abdominal pain, passing out, Inability to tolerate liquids or food, cough, altered mental status or any concerns. No signs of systemic illness or infection. The patient is nontoxic-appearing on exam and vital signs are within normal limits.  I have reviewed the triage vital signs and the nursing notes. Pertinent labs & imaging results that were available during my care of the patient were reviewed by me and considered in my medical decision making (see chart for details). After history, exam, and medical workup I feel the patient has been appropriately medically screened and is safe for discharge home. Pertinent diagnoses were discussed with the patient. Patient was given return precautions. Rx / DC Orders ED  Discharge Orders     None         Artha Chiasson, MD 12/15/21 [phone]

## 2021-12-16 DIAGNOSIS — Z09 Encounter for follow-up examination after completed treatment for conditions other than malignant neoplasm: Secondary | ICD-10-CM | POA: Diagnosis not present

## 2021-12-16 DIAGNOSIS — R252 Cramp and spasm: Secondary | ICD-10-CM | POA: Diagnosis not present

## 2021-12-17 DIAGNOSIS — R252 Cramp and spasm: Secondary | ICD-10-CM | POA: Diagnosis not present

## 2021-12-27 DIAGNOSIS — E669 Obesity, unspecified: Secondary | ICD-10-CM | POA: Diagnosis not present

## 2021-12-27 DIAGNOSIS — Z6836 Body mass index (BMI) 36.0-36.9, adult: Secondary | ICD-10-CM | POA: Diagnosis not present

## 2022-05-31 DIAGNOSIS — T148XXA Other injury of unspecified body region, initial encounter: Secondary | ICD-10-CM | POA: Diagnosis not present

## 2022-09-12 DIAGNOSIS — F321 Major depressive disorder, single episode, moderate: Secondary | ICD-10-CM | POA: Diagnosis not present

## 2022-09-12 DIAGNOSIS — R059 Cough, unspecified: Secondary | ICD-10-CM | POA: Diagnosis not present

## 2022-09-12 DIAGNOSIS — R0981 Nasal congestion: Secondary | ICD-10-CM | POA: Diagnosis not present

## 2022-09-12 DIAGNOSIS — J069 Acute upper respiratory infection, unspecified: Secondary | ICD-10-CM | POA: Diagnosis not present

## 2022-09-19 ENCOUNTER — Emergency Department (HOSPITAL_BASED_OUTPATIENT_CLINIC_OR_DEPARTMENT_OTHER)
Admission: EM | Admit: 2022-09-19 | Discharge: 2022-09-20 | Disposition: A | Discharge status: Home / Self Care | Payer: BC Managed Care – PPO | Attending: Emergency Medicine | Admitting: Emergency Medicine

## 2022-09-19 ENCOUNTER — Other Ambulatory Visit: Payer: Self-pay

## 2022-09-19 DIAGNOSIS — R0602 Shortness of breath: Secondary | ICD-10-CM | POA: Diagnosis not present

## 2022-09-19 DIAGNOSIS — R000 Tachycardia, unspecified: Secondary | ICD-10-CM | POA: Diagnosis not present

## 2022-09-19 DIAGNOSIS — T782XXA Anaphylactic shock, unspecified, initial encounter: Secondary | ICD-10-CM

## 2022-09-19 MED ORDER — DIPHENHYDRAMINE HCL 50 MG/ML IJ SOLN
25.0000 mg | Freq: Once | INTRAMUSCULAR | Status: AC
  Administered 2022-09-19: 25 mg via INTRAVENOUS
  Filled 2022-09-19: qty 1

## 2022-09-19 MED ORDER — EPINEPHRINE 0.3 MG/0.3ML IJ SOAJ
0.3000 mg | Freq: Once | INTRAMUSCULAR | Status: AC
  Administered 2022-09-19: 0.3 mg via INTRAMUSCULAR
  Filled 2022-09-19: qty 0.3

## 2022-09-19 MED ORDER — METHYLPREDNISOLONE SODIUM SUCC 125 MG IJ SOLR
125.0000 mg | Freq: Once | INTRAMUSCULAR | Status: AC
  Administered 2022-09-19: 125 mg via INTRAVENOUS
  Filled 2022-09-19: qty 2

## 2022-09-19 MED ORDER — FAMOTIDINE IN NACL 20-0.9 MG/50ML-% IV SOLN
20.0000 mg | Freq: Once | INTRAVENOUS | Status: AC
  Administered 2022-09-19: 20 mg via INTRAVENOUS
  Filled 2022-09-19: qty 50

## 2022-09-19 NOTE — ED Triage Notes (Addendum)
Pt here for hives and itching all over and sob that came on suddenly while he was sitting at home. Pt reports tightness in hands and fingers. Pt denies known allergies, states he didn't eat anything new today. Pt took a benadryl prior to coming in without any relief.

## 2022-09-19 NOTE — ED Provider Notes (Signed)
[place] [facility] AT [facility] Provider Note   CSN: [id number] Arrival date & time: 09/19/22  2253     History {Add pertinent medical, surgical, social history, OB history to HPI:1} Chief Complaint  Patient presents with   Allergic Reaction    Richard Orr is a 37 y.o. male.  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  Patient states that he was not doing any particular had not been exposed to anything that he knows of and all of a sudden felt it was hard to catch his breath.  They note she is itching all over.  He recognizes is allergic reaction and took a Benadryl did not seem to improve initially so came here for further evaluation.  No nausea, vomiting, diarrhea or lightheadedness.  No syncope.  No throat closing.  No tongue swelling.  Has never had a reaction like this before.  No recent illnesses.  At the time of arrival patient feels like he might be slightly improved from the Benadryl but still with significant pruritus.   Allergic Reaction      Home Medications Prior to Admission medications   Medication Sig Start Date End Date Taking? Authorizing Provider  cyclobenzaprine (FLEXERIL) 5 MG tablet Take 1 tablet (5 mg total) by mouth 3 (three) times daily as needed for muscle spasms. 08/30/17   Drenda Freeze, MD  ibuprofen (ADVIL) 400 MG tablet Take 1 tablet (400 mg total) by mouth every 6 (six) hours as needed. 12/15/21   Palumbo, April, MD  ibuprofen (ADVIL) 600 MG tablet Take 1 tablet (600 mg total) by mouth every 6 (six) hours as needed. 09/29/19   Charlann Lange, PA-C  methocarbamol (ROBAXIN) 500 MG tablet Take 1 tablet (500 mg total) by mouth 2 (two) times daily. 09/29/19   Charlann Lange, PA-C  naproxen (NAPROSYN) 500 MG tablet Take 1 tablet (500 mg total) by mouth 2 (two) times daily as needed (for knee pain). 11/30/16   Molpus, John, MD  traMADol (ULTRAM) 50 MG  tablet Take 1 tablet (50 mg total) by mouth every 6 (six) hours as needed. 08/30/17   Drenda Freeze, MD      Allergies    Penicillins and Bactrim [sulfamethoxazole-trimethoprim]    Review of Systems   Review of Systems  Physical Exam Updated Vital Signs BP 125/83 (BP Location: Right Arm)   Pulse (!) 133   Temp 98 F (36.7 C)   Resp 20   SpO2 95%  Physical Exam Vitals and nursing note reviewed.  Constitutional:      Appearance: He is well-developed.  HENT:     Head: Normocephalic and atraumatic.     Mouth/Throat:     Mouth: Mucous membranes are moist.  Eyes:     Pupils: Pupils are equal, round, and reactive to light.  Cardiovascular:     Rate and Rhythm: Tachycardia present.  Pulmonary:     Effort: Pulmonary effort is normal. No respiratory distress.     Comments: Tachypnea and mild hypoxia Abdominal:     General: Abdomen is flat. There is no distension.  Musculoskeletal:        General: Normal range of motion.     Cervical back: Normal range of motion.  Skin:    General: Skin is warm and dry.     Findings: Rash (Diffuse urticarial rash) present.  Neurological:     General: No focal deficit present.  Mental Status: He is alert.     ED Results / Procedures / Treatments   Labs (all labs ordered are listed, but only abnormal results are displayed) Labs Reviewed - No data to display  EKG None  Radiology No results found.  Procedures Procedures    Medications Ordered in ED Medications  famotidine (PEPCID) IVPB 20 mg premix (has no administration in time range)  methylPREDNISolone sodium succinate (SOLU-MEDROL) 125 mg/2 mL injection 125 mg (has no administration in time range)  diphenhydrAMINE (BENADRYL) injection 25 mg (has no administration in time range)  EPINEPHrine (EPI-PEN) injection 0.3 mg (0.3 mg Intramuscular Given 09/19/22 2319)    ED Course/ Medical Decision Making/ A&P                             Medical Decision Making Will treat  for anaphylaxis.  Will need to follow-up with allergist as he is never anything like this before.  Will observe for 3 to 4 hours for improvement after EpiPen. ***  {Document critical care time when appropriate:1} {Document review of labs and clinical decision tools ie heart score, Chads2Vasc2 etc:1}  {Document your independent review of radiology images, and any outside records:1} {Document your discussion with family members, caretakers, and with consultants:1} {Document social determinants of health affecting pt's care:1} {Document your decision making why or why not admission, treatments were needed:1} Final Clinical Impression(s) / ED Diagnoses Final diagnoses:  None    Rx / DC Orders ED Discharge Orders     None

## 2022-09-20 MED ORDER — EPINEPHRINE 0.3 MG/0.3ML IJ SOAJ: 0.3000 mg | INTRAMUSCULAR | 1 refills | Status: AC | PRN

## 2022-09-21 DIAGNOSIS — T782XXD Anaphylactic shock, unspecified, subsequent encounter: Secondary | ICD-10-CM | POA: Diagnosis not present

## 2022-10-14 DIAGNOSIS — M7651 Patellar tendinitis, right knee: Secondary | ICD-10-CM | POA: Diagnosis not present

## 2022-10-18 DIAGNOSIS — S83281A Other tear of lateral meniscus, current injury, right knee, initial encounter: Secondary | ICD-10-CM | POA: Diagnosis not present

## 2022-11-01 DIAGNOSIS — M25561 Pain in right knee: Secondary | ICD-10-CM | POA: Diagnosis not present

## 2022-11-08 DIAGNOSIS — M25561 Pain in right knee: Secondary | ICD-10-CM | POA: Diagnosis not present

## 2022-11-08 DIAGNOSIS — G8929 Other chronic pain: Secondary | ICD-10-CM | POA: Diagnosis not present

## 2022-12-05 DIAGNOSIS — R252 Cramp and spasm: Secondary | ICD-10-CM | POA: Diagnosis not present

## 2023-01-24 DIAGNOSIS — J069 Acute upper respiratory infection, unspecified: Secondary | ICD-10-CM | POA: Diagnosis not present

## 2023-01-24 DIAGNOSIS — J029 Acute pharyngitis, unspecified: Secondary | ICD-10-CM | POA: Diagnosis not present

## 2023-01-24 DIAGNOSIS — Z03818 Encounter for observation for suspected exposure to other biological agents ruled out: Secondary | ICD-10-CM | POA: Diagnosis not present

## 2023-01-24 DIAGNOSIS — R5383 Other fatigue: Secondary | ICD-10-CM | POA: Diagnosis not present

## 2023-01-24 DIAGNOSIS — R52 Pain, unspecified: Secondary | ICD-10-CM | POA: Diagnosis not present

## 2023-01-27 DIAGNOSIS — J069 Acute upper respiratory infection, unspecified: Secondary | ICD-10-CM | POA: Diagnosis not present

## 2023-02-14 DIAGNOSIS — F4320 Adjustment disorder, unspecified: Secondary | ICD-10-CM | POA: Diagnosis not present

## 2023-02-16 DIAGNOSIS — F332 Major depressive disorder, recurrent severe without psychotic features: Secondary | ICD-10-CM | POA: Diagnosis not present

## 2023-02-26 ENCOUNTER — Encounter (HOSPITAL_BASED_OUTPATIENT_CLINIC_OR_DEPARTMENT_OTHER): Payer: Self-pay

## 2023-02-26 ENCOUNTER — Other Ambulatory Visit: Payer: Self-pay

## 2023-02-26 ENCOUNTER — Emergency Department (HOSPITAL_BASED_OUTPATIENT_CLINIC_OR_DEPARTMENT_OTHER): Payer: BC Managed Care – PPO

## 2023-02-26 ENCOUNTER — Emergency Department (HOSPITAL_BASED_OUTPATIENT_CLINIC_OR_DEPARTMENT_OTHER)
Admission: EM | Admit: 2023-02-26 | Discharge: 2023-02-26 | Disposition: A | Discharge status: Home / Self Care | Payer: BC Managed Care – PPO | Attending: Emergency Medicine | Admitting: Emergency Medicine

## 2023-02-26 DIAGNOSIS — R0602 Shortness of breath: Secondary | ICD-10-CM | POA: Insufficient documentation

## 2023-02-26 DIAGNOSIS — R0789 Other chest pain: Secondary | ICD-10-CM | POA: Diagnosis not present

## 2023-02-26 DIAGNOSIS — R079 Chest pain, unspecified: Secondary | ICD-10-CM | POA: Diagnosis not present

## 2023-02-26 LAB — MAGNESIUM: Magnesium: 1.7 mg/dL (ref 1.7–2.4)

## 2023-02-26 LAB — BASIC METABOLIC PANEL
Anion gap: 10 (ref 5–15)
BUN: 9 mg/dL (ref 6–20)
CO2: 22 mmol/L (ref 22–32)
Calcium: 8.7 mg/dL — ABNORMAL LOW (ref 8.9–10.3)
Chloride: 102 mmol/L (ref 98–111)
Creatinine, Ser: 1.13 mg/dL (ref 0.61–1.24)
GFR, Estimated: >60 mL/min (ref >60)
Glucose, Bld: 141 mg/dL — ABNORMAL HIGH (ref 70–99)
Potassium: 3.1 mmol/L — ABNORMAL LOW (ref 3.5–5.1)
Sodium: 134 mmol/L — ABNORMAL LOW (ref 135–145)

## 2023-02-26 LAB — CBC
HCT: 42.6 % (ref 39.0–52.0)
Hemoglobin: 14.7 g/dL (ref 13.0–17.0)
MCH: 29.7 pg (ref 26.0–34.0)
MCHC: 34.5 g/dL (ref 30.0–36.0)
MCV: 86.1 fL (ref 80.0–100.0)
Platelets: 223 10*3/uL (ref 150–400)
RBC: 4.95 MIL/uL (ref 4.22–5.81)
RDW: 12.1 % (ref 11.5–15.5)
WBC: 5.2 10*3/uL (ref 4.0–10.5)
nRBC: 0 % (ref 0.0–0.2)

## 2023-02-26 LAB — TROPONIN I (HIGH SENSITIVITY): Troponin I (High Sensitivity): 2 ng/L (ref <18)

## 2023-02-26 LAB — D-DIMER, QUANTITATIVE: D-Dimer, Quant: <0.27 ug/mL-FEU (ref 0.00–0.50)

## 2023-02-26 MED ORDER — LACTATED RINGERS IV BOLUS
1000.0000 mL | Freq: Once | INTRAVENOUS | Status: AC
  Administered 2023-02-26: 1000 mL via INTRAVENOUS

## 2023-02-26 MED ORDER — MORPHINE SULFATE (PF) 4 MG/ML IV SOLN
4.0000 mg | Freq: Once | INTRAVENOUS | Status: AC
  Administered 2023-02-26: 4 mg via INTRAVENOUS
  Filled 2023-02-26: qty 1

## 2023-02-26 MED ORDER — KETOROLAC TROMETHAMINE 15 MG/ML IJ SOLN
15.0000 mg | Freq: Once | INTRAMUSCULAR | Status: AC
  Administered 2023-02-26: 15 mg via INTRAVENOUS
  Filled 2023-02-26: qty 1

## 2023-02-26 MED ORDER — POTASSIUM CHLORIDE 20 MEQ PO PACK
40.0000 meq | PACK | Freq: Once | ORAL | Status: AC
  Administered 2023-02-26: 40 meq via ORAL
  Filled 2023-02-26: qty 2

## 2023-02-26 NOTE — ED Triage Notes (Signed)
Chest pain that developed yesterday afternoon. Complains of SOB, dizziness
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
## 2023-02-26 NOTE — Discharge Instructions (Signed)
You were seen today for chest pain.  Your x-ray and lab work were reassuring.  I recommend you call your doctor tomorrow to schedule close follow-up.  You did have a low potassium, you should have this rechecked within the next week.  If you develop worsening chest pain, difficulty breathing or any other new concerning symptoms you should return to the ED.

## 2023-02-27 DIAGNOSIS — F4320 Adjustment disorder, unspecified: Secondary | ICD-10-CM | POA: Diagnosis not present

## 2023-02-27 DIAGNOSIS — R0789 Other chest pain: Secondary | ICD-10-CM | POA: Diagnosis not present

## 2023-03-15 DIAGNOSIS — F332 Major depressive disorder, recurrent severe without psychotic features: Secondary | ICD-10-CM | POA: Diagnosis not present

## 2023-04-12 DIAGNOSIS — F332 Major depressive disorder, recurrent severe without psychotic features: Secondary | ICD-10-CM | POA: Diagnosis not present

## 2023-04-12 DIAGNOSIS — F411 Generalized anxiety disorder: Secondary | ICD-10-CM | POA: Diagnosis not present

## 2023-05-05 ENCOUNTER — Other Ambulatory Visit: Payer: Self-pay

## 2023-05-05 ENCOUNTER — Emergency Department (HOSPITAL_COMMUNITY)
Admission: EM | Admit: 2023-05-05 | Discharge: 2023-05-05 | Disposition: A | Discharge status: Home / Self Care | Payer: BC Managed Care – PPO | Attending: Emergency Medicine | Admitting: Emergency Medicine

## 2023-05-05 ENCOUNTER — Encounter (HOSPITAL_COMMUNITY): Payer: Self-pay | Admitting: *Deleted

## 2023-05-05 ENCOUNTER — Emergency Department (HOSPITAL_COMMUNITY): Payer: BC Managed Care – PPO

## 2023-05-05 DIAGNOSIS — S060X0A Concussion without loss of consciousness, initial encounter: Secondary | ICD-10-CM | POA: Diagnosis not present

## 2023-05-05 DIAGNOSIS — Y92410 Unspecified street and highway as the place of occurrence of the external cause: Secondary | ICD-10-CM | POA: Diagnosis not present

## 2023-05-05 DIAGNOSIS — S0990XA Unspecified injury of head, initial encounter: Secondary | ICD-10-CM | POA: Diagnosis not present

## 2023-05-05 DIAGNOSIS — S134XXA Sprain of ligaments of cervical spine, initial encounter: Secondary | ICD-10-CM | POA: Insufficient documentation

## 2023-05-05 DIAGNOSIS — M25561 Pain in right knee: Secondary | ICD-10-CM | POA: Diagnosis not present

## 2023-05-05 DIAGNOSIS — S80919A Unspecified superficial injury of unspecified knee, initial encounter: Secondary | ICD-10-CM | POA: Diagnosis not present

## 2023-05-05 DIAGNOSIS — M542 Cervicalgia: Secondary | ICD-10-CM | POA: Diagnosis not present

## 2023-05-05 DIAGNOSIS — S199XXA Unspecified injury of neck, initial encounter: Secondary | ICD-10-CM | POA: Diagnosis not present

## 2023-05-05 MED ORDER — CYCLOBENZAPRINE HCL 5 MG PO TABS: 5.0000 mg | ORAL_TABLET | Freq: Three times a day (TID) | ORAL | 0 refills | Status: AC | PRN

## 2023-05-05 MED ORDER — CYCLOBENZAPRINE HCL 10 MG PO TABS
5.0000 mg | ORAL_TABLET | Freq: Once | ORAL | Status: AC
  Administered 2023-05-05: 5 mg via ORAL
  Filled 2023-05-05: qty 1

## 2023-05-05 MED ORDER — ONDANSETRON 4 MG PO TBDP: 4.0000 mg | ORAL_TABLET | Freq: Three times a day (TID) | ORAL | 0 refills | Status: AC | PRN

## 2023-05-05 MED ORDER — ONDANSETRON 4 MG PO TBDP
4.0000 mg | ORAL_TABLET | Freq: Once | ORAL | Status: AC
  Administered 2023-05-05: 4 mg via ORAL
  Filled 2023-05-05: qty 1

## 2023-05-05 MED ORDER — HYDROCODONE-ACETAMINOPHEN 5-325 MG PO TABS
1.0000 | ORAL_TABLET | Freq: Once | ORAL | Status: AC
  Administered 2023-05-05: 1 via ORAL
  Filled 2023-05-05: qty 1
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
## 2023-05-08 DIAGNOSIS — S060X0A Concussion without loss of consciousness, initial encounter: Secondary | ICD-10-CM | POA: Diagnosis not present

## 2023-05-10 DIAGNOSIS — F411 Generalized anxiety disorder: Secondary | ICD-10-CM | POA: Diagnosis not present

## 2023-05-10 DIAGNOSIS — F332 Major depressive disorder, recurrent severe without psychotic features: Secondary | ICD-10-CM | POA: Diagnosis not present

## 2023-05-11 DIAGNOSIS — S060X0A Concussion without loss of consciousness, initial encounter: Secondary | ICD-10-CM | POA: Diagnosis not present

## 2023-05-18 DIAGNOSIS — S060X0D Concussion without loss of consciousness, subsequent encounter: Secondary | ICD-10-CM | POA: Diagnosis not present

## 2023-05-30 DIAGNOSIS — S060X0D Concussion without loss of consciousness, subsequent encounter: Secondary | ICD-10-CM | POA: Diagnosis not present

## 2023-06-05 DIAGNOSIS — S060X0D Concussion without loss of consciousness, subsequent encounter: Secondary | ICD-10-CM | POA: Diagnosis not present

## 2023-06-07 DIAGNOSIS — F411 Generalized anxiety disorder: Secondary | ICD-10-CM | POA: Diagnosis not present

## 2023-06-07 DIAGNOSIS — F332 Major depressive disorder, recurrent severe without psychotic features: Secondary | ICD-10-CM | POA: Diagnosis not present

## 2023-06-19 ENCOUNTER — Telehealth: Payer: Self-pay | Admitting: Sports Medicine

## 2023-06-19 DIAGNOSIS — S060X0D Concussion without loss of consciousness, subsequent encounter: Secondary | ICD-10-CM | POA: Diagnosis not present
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
## 2023-06-22 ENCOUNTER — Ambulatory Visit: Payer: BC Managed Care – PPO | Admitting: Sports Medicine

## 2023-06-22 VITALS — BP 130/84 | HR 95 | Ht 72.0 in | Wt 232.0 lb

## 2023-06-22 DIAGNOSIS — S060X0A Concussion without loss of consciousness, initial encounter: Secondary | ICD-10-CM | POA: Diagnosis not present

## 2023-06-22 DIAGNOSIS — G44319 Acute post-traumatic headache, not intractable: Secondary | ICD-10-CM | POA: Diagnosis not present

## 2023-06-22 DIAGNOSIS — G4700 Insomnia, unspecified: Secondary | ICD-10-CM

## 2023-06-22 MED ORDER — TRAZODONE HCL 50 MG PO TABS: 50.0000 mg | ORAL_TABLET | Freq: Every evening | ORAL | 0 refills | Status: AC | PRN

## 2023-06-22 NOTE — Patient Instructions (Addendum)
-   Relative mental and physical rest for 48 hours after concussive event -Recommend light aerobic activity while keeping symptoms less than 3/10 -Stop mental or physical activities that cause symptoms to worsen greater than 3/10, and wait 24 hours before attempting them again -Eliminate screen time as much as possible for first 48 hours after concussive event, then continue limited screen time (recommend less than 2 hours per day)  Trazodone start 50 mg nightly and can increase 50 mg at a time until you find an effective dose that does not make you drowsy in the am   Work note provided  2 week follow up

## 2023-07-04 DIAGNOSIS — F332 Major depressive disorder, recurrent severe without psychotic features: Secondary | ICD-10-CM | POA: Diagnosis not present

## 2023-07-04 DIAGNOSIS — F411 Generalized anxiety disorder: Secondary | ICD-10-CM | POA: Diagnosis not present
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
## 2023-07-06 ENCOUNTER — Ambulatory Visit: Payer: BC Managed Care – PPO | Admitting: Sports Medicine

## 2023-07-06 VITALS — HR 89 | Ht 72.0 in | Wt 248.0 lb

## 2023-07-06 DIAGNOSIS — S060X0A Concussion without loss of consciousness, initial encounter: Secondary | ICD-10-CM | POA: Diagnosis not present

## 2023-07-06 DIAGNOSIS — G4700 Insomnia, unspecified: Secondary | ICD-10-CM | POA: Diagnosis not present

## 2023-07-06 DIAGNOSIS — G44319 Acute post-traumatic headache, not intractable: Secondary | ICD-10-CM

## 2023-07-06 NOTE — Patient Instructions (Addendum)
Work note provided 07/11/2023 Discontinue trazodone 3 week follow up

## 2023-07-10 DIAGNOSIS — F0781 Postconcussional syndrome: Secondary | ICD-10-CM | POA: Diagnosis not present

## 2023-07-10 DIAGNOSIS — G44329 Chronic post-traumatic headache, not intractable: Secondary | ICD-10-CM | POA: Diagnosis not present

## 2023-07-20 DIAGNOSIS — S060X0D Concussion without loss of consciousness, subsequent encounter: Secondary | ICD-10-CM | POA: Diagnosis not present

## 2023-07-25 DIAGNOSIS — R197 Diarrhea, unspecified: Secondary | ICD-10-CM | POA: Diagnosis not present

## 2023-07-27 ENCOUNTER — Encounter: Payer: BC Managed Care – PPO | Admitting: Sports Medicine
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
## 2023-07-31 ENCOUNTER — Encounter: Payer: BC Managed Care – PPO | Admitting: Sports Medicine

## 2023-08-01 DIAGNOSIS — F411 Generalized anxiety disorder: Secondary | ICD-10-CM | POA: Diagnosis not present

## 2023-08-01 DIAGNOSIS — F332 Major depressive disorder, recurrent severe without psychotic features: Secondary | ICD-10-CM | POA: Diagnosis not present

## 2023-09-30 ENCOUNTER — Other Ambulatory Visit: Payer: Self-pay | Admitting: Sports Medicine

## 2023-11-24 ENCOUNTER — Encounter: Payer: Self-pay | Admitting: Physician Assistant

## 2023-11-30 ENCOUNTER — Telehealth: Payer: Self-pay | Admitting: Sports Medicine

## 2023-11-30 NOTE — Telephone Encounter (Signed)
 Spoke with patient again on the phone and was able to transfer him to medical records.

## 2023-11-30 NOTE — Telephone Encounter (Signed)
 Patient called and asked if he can get the doctors notes where the doctor had him out of work faxed to [employer]. Patient provided their fax number.  [phone]. Please Advise.

## 2023-12-20 DIAGNOSIS — F332 Major depressive disorder, recurrent severe without psychotic features: Secondary | ICD-10-CM | POA: Diagnosis not present

## 2023-12-20 DIAGNOSIS — F411 Generalized anxiety disorder: Secondary | ICD-10-CM | POA: Diagnosis not present

## 2023-12-28 DIAGNOSIS — Z0000 Encounter for general adult medical examination without abnormal findings: Secondary | ICD-10-CM | POA: Diagnosis not present

## 2023-12-28 DIAGNOSIS — E782 Mixed hyperlipidemia: Secondary | ICD-10-CM | POA: Diagnosis not present

## 2023-12-28 DIAGNOSIS — F321 Major depressive disorder, single episode, moderate: Secondary | ICD-10-CM | POA: Diagnosis not present

## 2023-12-28 DIAGNOSIS — K219 Gastro-esophageal reflux disease without esophagitis: Secondary | ICD-10-CM | POA: Diagnosis not present

## 2023-12-28 DIAGNOSIS — E66811 Obesity, class 1: Secondary | ICD-10-CM | POA: Diagnosis not present

## 2024-01-10 DIAGNOSIS — B353 Tinea pedis: Secondary | ICD-10-CM | POA: Diagnosis not present

## 2024-01-15 DIAGNOSIS — F332 Major depressive disorder, recurrent severe without psychotic features: Secondary | ICD-10-CM | POA: Diagnosis not present

## 2024-01-15 DIAGNOSIS — F411 Generalized anxiety disorder: Secondary | ICD-10-CM | POA: Diagnosis not present

## 2024-02-16 DIAGNOSIS — F332 Major depressive disorder, recurrent severe without psychotic features: Secondary | ICD-10-CM | POA: Diagnosis not present

## 2024-02-16 DIAGNOSIS — F411 Generalized anxiety disorder: Secondary | ICD-10-CM | POA: Diagnosis not present

## 2024-02-19 DIAGNOSIS — R252 Cramp and spasm: Secondary | ICD-10-CM | POA: Diagnosis not present

## 2024-04-16 DIAGNOSIS — S76312A Strain of muscle, fascia and tendon of the posterior muscle group at thigh level, left thigh, initial encounter: Secondary | ICD-10-CM | POA: Diagnosis not present

## 2024-04-18 DIAGNOSIS — F332 Major depressive disorder, recurrent severe without psychotic features: Secondary | ICD-10-CM | POA: Diagnosis not present

## 2024-04-18 DIAGNOSIS — F411 Generalized anxiety disorder: Secondary | ICD-10-CM | POA: Diagnosis not present

## 2024-05-27 DIAGNOSIS — F411 Generalized anxiety disorder: Secondary | ICD-10-CM | POA: Diagnosis not present

## 2024-05-27 DIAGNOSIS — F332 Major depressive disorder, recurrent severe without psychotic features: Secondary | ICD-10-CM | POA: Diagnosis not present

## 2024-06-06 DIAGNOSIS — F332 Major depressive disorder, recurrent severe without psychotic features: Secondary | ICD-10-CM | POA: Diagnosis not present

## 2024-06-06 DIAGNOSIS — F411 Generalized anxiety disorder: Secondary | ICD-10-CM | POA: Diagnosis not present

## 2024-06-24 DIAGNOSIS — F411 Generalized anxiety disorder: Secondary | ICD-10-CM | POA: Diagnosis not present

## 2024-06-24 DIAGNOSIS — F332 Major depressive disorder, recurrent severe without psychotic features: Secondary | ICD-10-CM | POA: Diagnosis not present
